# Patient Record
Sex: Male | Born: 1990 | Race: Black or African American | Hispanic: No | Marital: Married | Smoking: Current every day smoker
Health system: Southern US, Community
[De-identification: ages and names within clinical notes are randomized; demographics above are authoritative.]

---
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
## 2014-06-25 ENCOUNTER — Encounter: Payer: Self-pay | Admitting: Emergency Medicine

## 2014-06-25 ENCOUNTER — Other Ambulatory Visit: Payer: Self-pay

## 2014-06-25 ENCOUNTER — Emergency Department
Admission: EM | Admit: 2014-06-25 | Discharge: 2014-06-25 | Disposition: A | Discharge status: Home / Self Care | Payer: 59 | Attending: Emergency Medicine | Admitting: Emergency Medicine

## 2014-06-25 DIAGNOSIS — Z720 Tobacco use: Secondary | ICD-10-CM | POA: Insufficient documentation

## 2014-06-25 DIAGNOSIS — T671XXA Heat syncope, initial encounter: Secondary | ICD-10-CM | POA: Insufficient documentation

## 2014-06-25 DIAGNOSIS — R55 Syncope and collapse: Secondary | ICD-10-CM | POA: Diagnosis present

## 2014-06-25 DIAGNOSIS — Y9389 Activity, other specified: Secondary | ICD-10-CM | POA: Insufficient documentation

## 2014-06-25 DIAGNOSIS — Y990 Civilian activity done for income or pay: Secondary | ICD-10-CM | POA: Insufficient documentation

## 2014-06-25 DIAGNOSIS — R001 Bradycardia, unspecified: Secondary | ICD-10-CM | POA: Diagnosis not present

## 2014-06-25 DIAGNOSIS — Y9289 Other specified places as the place of occurrence of the external cause: Secondary | ICD-10-CM | POA: Insufficient documentation

## 2014-06-25 DIAGNOSIS — X32XXXA Exposure to sunlight, initial encounter: Secondary | ICD-10-CM | POA: Diagnosis not present

## 2014-06-25 LAB — BASIC METABOLIC PANEL
ANION GAP: 8 (ref 5–15)
BUN: 11 mg/dL (ref 6–20)
CALCIUM: 9 mg/dL (ref 8.9–10.3)
CO2: 27 mmol/L (ref 22–32)
Chloride: 102 mmol/L (ref 101–111)
Creatinine, Ser: 1.25 mg/dL — ABNORMAL HIGH (ref 0.61–1.24)
GFR calc Af Amer: >60 mL/min (ref >60)
GLUCOSE: 86 mg/dL (ref 65–99)
POTASSIUM: 3.8 mmol/L (ref 3.5–5.1)
Sodium: 137 mmol/L (ref 135–145)

## 2014-06-25 LAB — CBC
HCT: 37.9 % — ABNORMAL LOW (ref 40.0–52.0)
Hemoglobin: 12.7 g/dL — ABNORMAL LOW (ref 13.0–18.0)
MCH: 28 pg (ref 26.0–34.0)
MCHC: 33.5 g/dL (ref 32.0–36.0)
MCV: 83.6 fL (ref 80.0–100.0)
PLATELETS: 199 10*3/uL (ref 150–440)
RBC: 4.53 MIL/uL (ref 4.40–5.90)
RDW: 13.8 % (ref 11.5–14.5)
WBC: 4.8 10*3/uL (ref 3.8–10.6)

## 2014-06-25 LAB — GLUCOSE, CAPILLARY: Glucose-Capillary: 82 mg/dL (ref 65–99)

## 2014-06-25 NOTE — ED Provider Notes (Signed)
[facility] Emergency Department Provider Note     Time seen: ----------------------------------------- 9:21 PM on 06/25/2014 -----------------------------------------    I have reviewed the triage vital signs and the nursing notes.   HISTORY  Chief Complaint Loss of Consciousness    HPI Troy Holmes is a 24 y.o. male who presents ER for syncopal episode while does work. Patient states he thinks he got overheated and he passed out for a split second". Patient reports someone did catch him, he has a history of same where he gets overheated. Denies any chest pain but he did have some shortness of breath. Currently denies complaints, feels better now. It occurred about 7:30 PM   History reviewed. No pertinent past medical history.  There are no active problems to display for this patient.   History reviewed. No pertinent past surgical history.  Allergies Review of patient's allergies indicates no known allergies.  Social History History  Substance Use Topics  . Smoking status: Current Some Day Smoker  . Smokeless tobacco: Not on file  . Alcohol Use: Yes    Review of Systems Constitutional: Negative for fever. Eyes: Negative for visual changes. ENT: Negative for sore throat. Cardiovascular: Negative for chest pain. Respiratory: Negative for shortness of breath. Gastrointestinal: Negative for abdominal pain, vomiting and diarrhea. Genitourinary: Negative for dysuria. Musculoskeletal: Negative for back pain. Skin: Negative for rash. Neurological: Negative for headaches, focal weakness or numbness.  10-point ROS otherwise negative.  ____________________________________________   PHYSICAL EXAM:  VITAL SIGNS: ED Triage Vitals  Enc Vitals Group     BP 06/25/14 2042 124/74 mmHg     Pulse Rate 06/25/14 2042 55     Resp 06/25/14 2042 18     Temp 06/25/14 2042 98.4 F (36.9 C)     Temp Source 06/25/14 2042 Oral     SpO2 06/25/14 2042 100  %     Weight 06/25/14 2042 147 lb (66.679 kg)     Height 06/25/14 2042  (1.803 m)     Head Cir --      Peak Flow --      Pain Score 06/25/14 2051 0     Pain Loc --      Pain Edu? --      Excl. in GC? --     Constitutional: Alert and oriented. Well appearing and in no distress. Eyes: Conjunctivae are normal. PERRL. Normal extraocular movements. ENT   Head: Normocephalic and atraumatic.   Nose: No congestion/rhinnorhea.   Mouth/Throat: Mucous membranes are moist.   Neck: No stridor. Hematological/Lymphatic/Immunilogical: No cervical lymphadenopathy. Cardiovascular: Slow rate, regular rhythm. Normal and symmetric distal pulses are present in all extremities. No murmurs, rubs, or gallops. Respiratory: Normal respiratory effort without tachypnea nor retractions. Breath sounds are clear and equal bilaterally. No wheezes/rales/rhonchi. Gastrointestinal: Soft and nontender. No distention. No abdominal bruits. There is no CVA tenderness. Musculoskeletal: Nontender with normal range of motion in all extremities. No joint effusions.  No lower extremity tenderness nor edema. Neurologic:  Normal speech and language. No gross focal neurologic deficits are appreciated. Speech is normal. No gait instability. Skin:  Skin is warm, dry and intact. No rash noted. Psychiatric: Mood and affect are normal. Speech and behavior are normal. Patient exhibits appropriate insight and judgment. ____________________________________________  EKG: Interpreted by me. Market sinus bradycardia with a rate of 47, otherwise normal axis normal intervals, no evidence of hypertrophy or acute infarction  ____________________________________________  ED COURSE:  Pertinent labs & imaging results that were available  during my care of the patient were reviewed by me and considered in my medical decision making (see chart for details). We'll check basic labs and  reevaluate. ____________________________________________    LABS (pertinent positives/negatives)  Labs Reviewed  CBC - Abnormal; Notable for the following:    Hemoglobin 12.7 (*)    HCT 37.9 (*)    All other components within normal limits  BASIC METABOLIC PANEL - Abnormal; Notable for the following:    Creatinine, Ser 1.25 (*)    All other components within normal limits  GLUCOSE, CAPILLARY  CBG MONITORING, ED    RADIOLOGY  None  ____________________________________________  FINAL ASSESSMENT AND PLAN  He related illness, bradycardia  Plan: Labs here grossly unremarkable. Patient stable for outpatient follow-up with cardiology due to his bradycardia.   Emily FilbertWilliams, Efstathios Sawin E, MD   Emily FilbertJonathan E Demarious Kapur, MD 06/25/14 [phone]

## 2014-06-25 NOTE — Discharge Instructions (Signed)
Bradycardia Bradycardia is a term for a heart rate (pulse) that, in adults, is slower than 60 beats per minute. A normal rate is 60 to 100 beats per minute. A heart rate below 60 beats per minute may be normal for some adults with healthy hearts. If the rate is too slow, the heart may have trouble pumping the volume of blood the body needs. If the heart rate gets too low, blood flow to the brain may be decreased and may make you feel lightheaded, dizzy, or faint. The heart has a natural pacemaker in the top of the heart called the SA node (sinoatrial or sinus node). This pacemaker sends out regular electrical signals to the muscle of the heart, telling the heart muscle when to beat (contract). The electrical signal travels from the upper parts of the heart (atria) through the AV node (atrioventricular node), to the lower chambers of the heart (ventricles). The ventricles squeeze, pumping the blood from your heart to your lungs and to the rest of your body. CAUSES   Problem with the heart's electrical system.  Problem with the heart's natural pacemaker.  Heart disease, damage, or infection.  Medications.  Problems with minerals and salts (electrolytes). SYMPTOMS   Fainting (syncope).  Fatigue and weakness.  Shortness of breath (dyspnea).  Chest pain (angina).  Drowsiness.  Confusion. DIAGNOSIS   An electrocardiogram (ECG) can help your caregiver determine the type of slow heart rate you have.  If the cause is not seen on an ECG, you may need to wear a heart monitor that records your heart rhythm for several hours or days.  Blood tests. TREATMENT   Electrolyte supplements.  Medications.  Withholding medication which is causing a slow heart rate.  Pacemaker placement. SEEK IMMEDIATE MEDICAL CARE IF:   You feel lightheaded or faint.  You develop an irregular heart rate.  You feel chest pain or have trouble breathing. MAKE SURE YOU:   Understand these  instructions.  Will watch your condition.  Will get help right away if you are not doing well or get worse. Document Released: 09/27/2001 Document Revised: 03/30/2011 Document Reviewed: 04/12/2013 [facility] Patient Information 2015 [place], [place]. This information is not intended to replace advice given to you by your health care provider. Make sure you discuss any questions you have with your health care provider.  Heat-Related Illness Heat-related illnesses occur when the body is unable to properly cool itself. The body normally cools itself by sweating. However, under some conditions sweating is not enough. In these cases, a person's body temperature rises rapidly. Very high body temperatures may damage the brain or other vital organs. Some examples of heat-related illnesses include:  Heat stroke. This occurs when the body is unable to regulate its temperature. The body's temperature rises rapidly, the sweating mechanism fails, and the body is unable to cool down. Body temperature may rise to 106 F (41 C) or higher within 10 to 15 minutes. Heat stroke can cause death or permanent disability if emergency treatment is not provided.  Heat exhaustion. This is a milder form of heat-related illness that can develop after several days of exposure to high temperatures and not enough fluids. It is the body's response to an excessive loss of the water and salt contained in sweat.  Heat cramps. These usually affect people who sweat a lot during heavy activity. This sweating drains the body's salt and moisture. The low salt level in the muscles causes painful cramps. Heat cramps may also be a symptom  of heat exhaustion. Heat cramps usually occur in the abdomen, arms, or legs. Get medical attention for cramps if you have heart problems or are on a low-sodium diet. Those that are at greatest risk for heat-related illnesses include:   The elderly.  Infant and the very young.  People with mental illness  and chronic diseases.  People who are overweight (obese).  Young and healthy people can even succumb to heat if they participate in strenuous physical activities during hot weather. CAUSES  Several factors affect the body's ability to cool itself during extremely hot weather. When the humidity is high, sweat will not evaporate as quickly. This prevents the body from releasing heat quickly. Other factors that can affect the body's ability to cool down include:   Age.  Obesity.  Fever.  Dehydration.  Heart disease.  Mental illness.  Poor circulation.  Sunburn.  Prescription drug use.  Alcohol use. SYMPTOMS  Heat stroke: Warning signs of heat stroke vary, but may include:  An extremely high body temperature (above 103F orally).  A fast, strong pulse.  Dizziness.  Confusion.  Red, hot, and dry skin.  No sweating.  Throbbing headache.  Feeling sick to your stomach (nauseous).  Unconsciousness. Heat exhaustion: Warning signs of heat exhaustion include:  Heavy sweating.  Tiredness.  Headache.  Paleness.  Weakness.  Feeling sick to your stomach (nauseous) or vomiting.  Muscle cramps. Heat cramps  Muscle pains or spasms. TREATMENT  Heat stroke  Get into a cool environment. An indoor place that is air-conditioned may be best.  Take a cool shower or bath. Have someone around to make sure you are okay.  Take your temperature. Make sure it is going down. Heat exhaustion  Drink plenty of fluids. Do not drink liquids that contain caffeine, alcohol, or large amounts of sugar. These cause you to lose more body fluid. Also, avoid very cold drinks. They can cause stomach cramps.  Get into a cool environment. An indoor place that is air-conditioned may be best.  Take a cool shower or bath. Have someone around to make sure you are okay.  Put on lightweight clothing. Heat cramps  Stop whatever activity you were doing. Do not attempt to do that activity  for at least 3 hours after the cramps have gone away.  Get into a cool environment. An indoor place that is air-conditioned may be best. HOME CARE INSTRUCTIONS  To protect your health when temperatures are extremely high, follow these tips:  During heavy exercise in a hot environment, drink two to four glasses (16-32 ounces) of cool fluids each hour. Do not wait until you are thirsty to drink. Warning: If your caregiver limits the amount of fluid you drink or has you on water pills, ask how much you should drink while the weather is hot.  Do not drink liquids that contain caffeine, alcohol, or large amounts of sugar. These cause you to lose more body fluid.  Avoid very cold drinks. They can cause stomach cramps.  Wear appropriate clothing. Choose lightweight, light-colored, loose-fitting clothing.  If you must be outdoors, try to limit your outdoor activity to morning and evening hours. Try to rest often in shady areas.  If you are not used to working or exercising in a hot environment, start slowly and pick up the pace gradually.  Stay cool in an air-conditioned place if possible. If your home does not have air conditioning, go to the shopping mall or [employer].  Taking a cool shower or  bath may help you cool off. SEEK MEDICAL CARE IF:   You see any of the symptoms listed above. You may be dealing with a life-threatening emergency.  Symptoms worsen or last longer than 1 hour.  Heat cramps do not get better in 1 hour. MAKE SURE YOU:   Understand these instructions.  Will watch your condition.  Will get help right away if you are not doing well or get worse. Document Released: 10/15/2007 Document Revised: 03/30/2011 Document Reviewed: 10/15/2007 [facility] Patient Information 2015 [place], [place]. This information is not intended to replace advice given to you by your health care provider. Make sure you discuss any questions you have with your health care provider.

## 2014-06-25 NOTE — ED Notes (Signed)
Pt [facility] by [facility] from work after he experienced a syncopal episode. Pt states he was working and had gotten hot and passed out "for a split second". Pt reports someone did catch him. Hx of the same. Denies chest pain but does report some shortness of breath. Pt currently has no increased work of breathing noted. Pt alert and calm; answering questions without difficulty. No distress noted.

## 2014-06-25 NOTE — ED Notes (Signed)
Pt to STAT registration via w/c brought in by EMS from [employer]; pt st while working today had near syncopal episode due to heat; EMS st this is 2nd call at same place today for same reason (different person); pt denies c/o at present

## 2014-07-12 ENCOUNTER — Encounter: Payer: Self-pay | Admitting: Emergency Medicine

## 2014-07-12 ENCOUNTER — Emergency Department
Admission: EM | Admit: 2014-07-12 | Discharge: 2014-07-12 | Disposition: A | Discharge status: Home / Self Care | Payer: 59 | Attending: Emergency Medicine | Admitting: Emergency Medicine

## 2014-07-12 DIAGNOSIS — E860 Dehydration: Secondary | ICD-10-CM | POA: Insufficient documentation

## 2014-07-12 DIAGNOSIS — J0110 Acute frontal sinusitis, unspecified: Secondary | ICD-10-CM | POA: Diagnosis not present

## 2014-07-12 DIAGNOSIS — Z720 Tobacco use: Secondary | ICD-10-CM | POA: Insufficient documentation

## 2014-07-12 DIAGNOSIS — R0602 Shortness of breath: Secondary | ICD-10-CM | POA: Diagnosis present

## 2014-07-12 LAB — CBC WITH DIFFERENTIAL/PLATELET
Basophils Absolute: 0 10*3/uL (ref 0–0.1)
Basophils Relative: 1 %
Eosinophils Absolute: 0.1 10*3/uL (ref 0–0.7)
Eosinophils Relative: 1 %
HCT: 41.9 % (ref 40.0–52.0)
Hemoglobin: 13.8 g/dL (ref 13.0–18.0)
LYMPHS ABS: 2 10*3/uL (ref 1.0–3.6)
LYMPHS PCT: 44 %
MCH: 27.2 pg (ref 26.0–34.0)
MCHC: 32.9 g/dL (ref 32.0–36.0)
MCV: 82.8 fL (ref 80.0–100.0)
MONOS PCT: 7 %
Monocytes Absolute: 0.3 10*3/uL (ref 0.2–1.0)
NEUTROS PCT: 47 %
Neutro Abs: 2.2 10*3/uL (ref 1.4–6.5)
PLATELETS: 217 10*3/uL (ref 150–440)
RBC: 5.06 MIL/uL (ref 4.40–5.90)
RDW: 14 % (ref 11.5–14.5)
WBC: 4.6 10*3/uL (ref 3.8–10.6)

## 2014-07-12 LAB — BASIC METABOLIC PANEL
Anion gap: 5 (ref 5–15)
BUN: 12 mg/dL (ref 6–20)
CALCIUM: 8.8 mg/dL — AB (ref 8.9–10.3)
CO2: 30 mmol/L (ref 22–32)
Chloride: 103 mmol/L (ref 101–111)
Creatinine, Ser: 1.21 mg/dL (ref 0.61–1.24)
GFR calc Af Amer: >60 mL/min (ref >60)
GFR calc non Af Amer: >60 mL/min (ref >60)
Glucose, Bld: 80 mg/dL (ref 65–99)
POTASSIUM: 3.8 mmol/L (ref 3.5–5.1)
SODIUM: 138 mmol/L (ref 135–145)

## 2014-07-12 MED ORDER — DEXAMETHASONE SODIUM PHOSPHATE 10 MG/ML IJ SOLN
INTRAMUSCULAR | Status: AC
  Administered 2014-07-12: 10 mg via INTRAVENOUS
  Filled 2014-07-12: qty 1

## 2014-07-12 MED ORDER — DIPHENHYDRAMINE HCL 50 MG/ML IJ SOLN
INTRAMUSCULAR | Status: AC
  Administered 2014-07-12: 25 mg via INTRAVENOUS
  Filled 2014-07-12: qty 1

## 2014-07-12 MED ORDER — SODIUM CHLORIDE 0.9 % IV BOLUS (SEPSIS)
1000.0000 mL | Freq: Once | INTRAVENOUS | Status: AC
  Administered 2014-07-12: 1000 mL via INTRAVENOUS

## 2014-07-12 MED ORDER — LORATADINE-PSEUDOEPHEDRINE ER 5-120 MG PO TB12: 1.0000 | ORAL_TABLET | Freq: Two times a day (BID) | ORAL | Status: AC

## 2014-07-12 MED ORDER — OXYMETAZOLINE HCL 0.05 % NA SOLN: 2.0000 | Freq: Two times a day (BID) | NASAL | Status: DC

## 2014-07-12 MED ORDER — DEXAMETHASONE SODIUM PHOSPHATE 10 MG/ML IJ SOLN
10.0000 mg | Freq: Once | INTRAMUSCULAR | Status: AC
Start: 2014-07-12 — End: 2014-07-12
  Administered 2014-07-12: 10 mg via INTRAVENOUS

## 2014-07-12 MED ORDER — IBUPROFEN 800 MG PO TABS: 800.0000 mg | ORAL_TABLET | Freq: Three times a day (TID) | ORAL | Status: DC | PRN

## 2014-07-12 MED ORDER — DIPHENHYDRAMINE HCL 50 MG/ML IJ SOLN
25.0000 mg | Freq: Once | INTRAMUSCULAR | Status: AC
  Administered 2014-07-12: 25 mg via INTRAVENOUS

## 2014-07-12 NOTE — Discharge Instructions (Signed)
Dehydration in Sports The body uses the kidney, bladder, and thirst mechanisms in an intricate system to maintain the proper fluid levels in the body. When this system is stressed, such as during exercise, the system may not be able to maintain these levels. This results in the body lacking water (dehydration). Dehydration can be a problem because the body requires a certain amount of water and other fluids to maintain its blood volume. Fluid is lost when you urinate, sweat, breathe, vomit, or have diarrhea. Dehydration occurs when you drink less fluid than you lose. Dehydration may occur even before you become thirsty. It is important for athletes to keep drinking during activity, even if they do not feel thirsty. SYMPTOMS   Thirst.  Dry mouth.  Tiredness (lethargy).  Dark urine.  Headache.  Muscle cramps.  Rapid breathing.  Lightheadedness, especially when you stand from a sitting position.  Dry, warm skin.  Little or no urination.  Low blood pressure.  Fainting (syncope).  Delirium or unconsciousness. RISK INCREASES WITH:  Diarrhea.  Vomiting.  Inadequate fluid intake during an illness or strenuous exercise.  Inadequate food intake during an illness, during strenuous exercise, or after strenuous exercise.  Use of diuretic medicines, which control excess body fluid by causing fluid loss.  Certain age groups. Infants and the elderly are at greater risk. PREVENTION  Drink frequently throughout physical activity even if you do not feel thirsty. Drink small amounts of fluid frequently throughout and after sporting events.  Drink extra fluids to keep up with any ongoing losses (sweating, diarrhea).  Carry extra water and the ingredients for making an oral rehydration solution (ORS).  If you have diarrhea or vomiting or you are not drinking much, force yourself to drink more liquids before you become dehydrated. RELATED COMPLICATIONS   Reduced ability to dissipate  heat, resulting in elevated core body temperatures.  Heat illness.  Heat stroke.  Kidney failure. TREATMENT Mild dehydration is treated by drinking enough fluid to replace the fluids you have lost. You may also need to replace the electrolytes you have lost. Recommendations for replenishing fluids and electrolytes include drinking sips of water slowly, eating foods with salt, drinking sports drinks, or taking over-the-counter dehydration medicines. Treat dehydration immediately. Do not wait until dehydration becomes severe.  Packets of ORS are widely available. Follow the directions on the packet. If no instructions are given, mix the contents of the ORS with 1 quart or liter of drinking water. If you are not sure if the water is safe to drink, first boil the water for at least 5 minutes.  If you are unable to obtain ORS you may create your own by adding 2 tbs of sugar or honey,  tsp salt, and  tsp baking soda to 1 quart or liter of water. If you do not have any baking soda, add another  tsp of salt. If possible, add  cup orange juice or some mashed banana to improve the taste and provide some potassium. Drink sips of the ORS every 5 minutes until urination becomes normal. It is normal to urinate 4 or 5 times a day. Adults and adolescents should drink at least 3 quarts or liters of ORS a day until they are well. For individuals who are vomiting or have diarrhea, it is important to keep trying to drink the ORS. Your body may retain some of the fluids and salts you need even if you are vomiting or have diarrhea. Remember to take only sips of liquids. Chilling   the ORS may help. Severe dehydration is a medical emergency. If you have symptoms of severe dehydration, seek medical care immediately. It may be necessary for you to receive intravenous (IV) fluids. If you are able to drink, you should also drink the ORS. With treatment for dehydration, whatever is causing diarrhea, vomiting, or other symptoms  should also be treated.  Document Released: 01/05/2005 Document Revised: 03/30/2011 Document Reviewed: 04/19/2008 [employer] Patient Information 2015 [employer]. This information is not intended to replace advice given to you by your health care provider. Make sure you discuss any questions you have with your health care provider.  

## 2014-07-12 NOTE — ED Notes (Signed)
Pt states sore throat and difficulty to breathe out of his nose, pt also states dizziness when it gets too hot outside, pt in no distress upon assessment

## 2014-07-12 NOTE — ED Notes (Signed)
Pt alert and oriented X4, active, cooperative, pt in NAD. RR even and unlabored, color WNL.  Pt informed to return if any life threatening symptoms occur.   

## 2014-07-12 NOTE — ED Provider Notes (Signed)
[facility] Emergency Department Provider Note  ____________________________________________  Time seen: Approximately 2:29 PM  I have reviewed the triage vital signs and the nursing notes.   HISTORY  Chief Complaint Shortness of Breath    HPI Troy Holmes is a 24 y.o. 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  There are no active problems to display for this patient.   No past surgical history on file.  Current Outpatient Rx  Name  Route  Sig  Dispense  Refill  . ibuprofen (ADVIL,MOTRIN) 800 MG tablet   Oral   Take 1 tablet (800 mg total) by mouth every 8 (eight) hours as needed.   30 tablet   0   . loratadine-pseudoephedrine (CLARITIN-D 12 HOUR) 5-120 MG per tablet   Oral   Take 1 tablet by mouth 2 (two) times daily.   20 tablet   0   . oxymetazoline (AFRIN) 0.05 % nasal spray   Each Nare   Place 2 sprays into both nostrils 2 (two) times daily. For 3 days   15 mL   2     Allergies Review of patient's allergies indicates no known allergies.  History reviewed. No pertinent family history.  Social History History  Substance Use Topics  . Smoking status: Current Some Day Smoker    Types: Cigarettes  . Smokeless tobacco: Not on file  . Alcohol Use: Yes    Review of Systems Constitutional: No fever/chills Eyes: No visual changes. ENT: No sore  throat. Cardiovascular: Denies chest pain. Respiratory: Denies shortness of breath. Gastrointestinal: No abdominal pain.  No nausea, no vomiting.  No diarrhea.  No constipation. Genitourinary: Negative for dysuria. Musculoskeletal: Negative for back pain. Skin: Negative for rash. Neurological: Negative for headaches, focal weakness or numbness.  10-point ROS otherwise negative.  ____________________________________________   PHYSICAL EXAM:  VITAL SIGNS: ED Triage Vitals  Enc Vitals Group     BP 07/12/14 1234 113/65 mmHg     Pulse Rate 07/12/14 1234 76     Resp 07/12/14 1234 18     Temp 07/12/14 1234 98.7 F (37.1 C)     Temp Source 07/12/14 1234 Oral     SpO2 07/12/14 1234 99 %     Weight 07/12/14 1234 138 lb (62.596 kg)     Height 07/12/14 1234 5\' 11"  (1.803 m)     Head Cir --      Peak Flow --      Pain Score 07/12/14 1235 2     Pain Loc --      Pain Edu? --      Excl. in GC? --     Constitutional: Alert and oriented. Well appearing and in no acute distress. Eyes: Conjunctivae are normal. PERRL. EOMI. Head: Atraumatic. Frontal sinus tenderness Nose: congestion/rhinnorhea. Mouth/Throat: Mucous membranes are moist.  Oropharynx non-erythematous. Neck: No stridor.   Cardiovascular: Normal  rate, regular rhythm. Grossly normal heart sounds.  Good peripheral circulation. Respiratory: Normal respiratory effort.  No retractions. Lungs CTAB. Gastrointestinal: Soft and nontender. No distention. No abdominal bruits. No CVA tenderness. Musculoskeletal: No lower extremity tenderness nor edema.  No joint effusions. Neurologic:  Normal speech and language. No gross focal neurologic deficits are appreciated. Speech is normal. No gait instability. Skin:  Skin is warm, dry and intact. No rash noted. Psychiatric: Mood and affect are normal. Speech and behavior are normal.  ____________________________________________   LABS (all labs ordered are listed, but only abnormal results  are displayed)  Labs Reviewed  CBC WITH DIFFERENTIAL/PLATELET  BASIC METABOLIC PANEL   ____________________________________________   PROCEDURES  Procedure(s) performed: None  Critical Care performed: No  ____________________________________________   INITIAL IMPRESSION / ASSESSMENT AND PLAN / ED COURSE  Pertinent labs & imaging results that were available during my care of the patient were reviewed by me and considered in my medical decision making (see chart for details).  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

## 2014-07-12 NOTE — ED Notes (Addendum)
Pt states he has had some sob since last night, denies any coughing, states "I think it is because I am trying to quite smoking black and milds", pt also states he felt dizzy and lightheaded after walking around the block, denies any dizziness at present, also c/o sore throat and nasal congestion

## 2014-12-18 ENCOUNTER — Encounter (HOSPITAL_COMMUNITY): Payer: Self-pay | Admitting: Emergency Medicine

## 2014-12-18 ENCOUNTER — Emergency Department (HOSPITAL_COMMUNITY)
Admission: EM | Admit: 2014-12-18 | Discharge: 2014-12-19 | Disposition: A | Discharge status: Home / Self Care | Payer: 59 | Attending: Emergency Medicine | Admitting: Emergency Medicine

## 2014-12-18 ENCOUNTER — Emergency Department (HOSPITAL_COMMUNITY): Payer: 59

## 2014-12-18 DIAGNOSIS — F17210 Nicotine dependence, cigarettes, uncomplicated: Secondary | ICD-10-CM | POA: Insufficient documentation

## 2014-12-18 DIAGNOSIS — R55 Syncope and collapse: Secondary | ICD-10-CM | POA: Insufficient documentation

## 2014-12-18 DIAGNOSIS — R51 Headache: Secondary | ICD-10-CM | POA: Insufficient documentation

## 2014-12-18 DIAGNOSIS — R519 Headache, unspecified: Secondary | ICD-10-CM

## 2014-12-18 MED ORDER — DIPHENHYDRAMINE HCL 25 MG PO CAPS
50.0000 mg | ORAL_CAPSULE | Freq: Once | ORAL | Status: AC
  Administered 2014-12-18: 50 mg via ORAL
  Filled 2014-12-18: qty 2

## 2014-12-18 MED ORDER — PREDNISONE 20 MG PO TABS
60.0000 mg | ORAL_TABLET | Freq: Once | ORAL | Status: AC
  Administered 2014-12-18: 60 mg via ORAL
  Filled 2014-12-18: qty 3

## 2014-12-18 MED ORDER — METHYLPREDNISOLONE SODIUM SUCC 125 MG IJ SOLR: 125.0000 mg | Freq: Once | INTRAMUSCULAR | Status: DC

## 2014-12-18 MED ORDER — DIPHENHYDRAMINE HCL 50 MG/ML IJ SOLN: 50.0000 mg | Freq: Once | INTRAMUSCULAR | Status: DC

## 2014-12-18 MED ORDER — PROCHLORPERAZINE EDISYLATE 5 MG/ML IJ SOLN: 10.0000 mg | Freq: Four times a day (QID) | INTRAMUSCULAR | Status: DC | PRN

## 2014-12-18 MED ORDER — KETOROLAC TROMETHAMINE 60 MG/2ML IM SOLN
60.0000 mg | Freq: Once | INTRAMUSCULAR | Status: AC
  Administered 2014-12-18: 60 mg via INTRAMUSCULAR
  Filled 2014-12-18: qty 2

## 2014-12-18 MED ORDER — KETOROLAC TROMETHAMINE 30 MG/ML IJ SOLN: 30.0000 mg | Freq: Once | INTRAMUSCULAR | Status: DC

## 2014-12-18 MED ORDER — METOCLOPRAMIDE HCL 10 MG PO TABS
10.0000 mg | ORAL_TABLET | Freq: Once | ORAL | Status: AC
  Administered 2014-12-18: 10 mg via ORAL
  Filled 2014-12-18: qty 1

## 2014-12-18 MED ORDER — SODIUM CHLORIDE 0.9 % IV BOLUS (SEPSIS): 1000.0000 mL | Freq: Once | INTRAVENOUS | Status: DC

## 2014-12-18 NOTE — Discharge Instructions (Signed)
You have been seen today for headache and near syncope. Your imaging and lab tests showed no abnormalities. Follow up with PCP as soon as possible on this matter. Return to ED should symptoms worsen. Be sure to drink plenty of fluids, get plenty of rest, and eat normal meals. You may take ibuprofen or Tylenol with caffeine for future headaches.   Emergency Department Resource Guide 1) Find a Doctor and Pay Out of Pocket Although you won't have to find out who is covered by your insurance plan, it is a good idea to ask around and get recommendations. You will then need to call the office and see if the doctor you have chosen will accept you as a new patient and what types of options they offer for patients who are self-pay. Some doctors offer discounts or will set up payment plans for their patients who do not have insurance, but you will need to ask so you aren't surprised when you get to your appointment.  2) Contact Your Local Health Department Not all health departments have doctors that can see patients for sick visits, but many do, so it is worth a call to see if yours does. If you don't know where your local health department is, you can check in your phone book. The CDC also has a tool to help you locate your state's health department, and many state websites also have listings of all of their local health departments.  3) Find a Walk-in Clinic If your illness is not likely to be very severe or complicated, you may want to try a walk in clinic. These are popping up all over the country in pharmacies, drugstores, and shopping centers. They're usually staffed by nurse practitioners or physician assistants that have been trained to treat common illnesses and complaints. They're usually fairly quick and inexpensive. However, if you have serious medical issues or chronic medical problems, these are probably not your best option.  No Primary Care Doctor: - Call Health Connect at  [phone] - they can  help you locate a primary care doctor that  accepts your insurance, provides certain services, etc. - Physician Referral Service- [phone]  Chronic Pain Problems: Organization         Address  Phone   Notes  [facility]  [phone] Patients need to be referred by their primary care doctor.   Medication Assistance: Organization         Address  Phone   Notes  [facility] [address] [place], [place] [id number] [phone] --Must be a resident of [facility] -- Must have NO insurance coverage whatsoever (no Medicaid/ Medicare, etc.) -- The pt. MUST have a primary care doctor that directs their care regularly and follows them in the community   MedAssist  [phone]   [employer]  [phone]    Agencies that provide inexpensive medical care: Organization         Address  Phone   Notes  [facility]  [phone]   [facility]    [phone]   [facility] [address] [place], [place] [address] [phone]   [facility] [address], [place] [phone]   Planned Parenthood    [phone]   [facility]    [phone]   [facility] and [facility]  [address], [place] Phone:  [phone], Fax:  [phone] Hours of Operation:  9 am - 6 pm, M-F.  Also accepts Medicaid/Medicare and self-pay.  [facility] for [place], [address], [place] Phone: [phone], Fax: [phone]. Hours of Operation:  8:30 am - 5:30 pm, M-F.  Also accepts Medicaid and self-pay.  [facility] [address] [place] Phone: [phone]   [place], [place], [place] [phone], Ext. [phone] Mondays & Thursdays: 7-9 AM.  First 15 patients are seen on a first come, first serve basis.    [place] Providers:  Organization         Address  Phone   Notes  [facility] [address], [place] [phone] Also accepts self-pay patients.  [facility] [id number] [place], [place]  [phone]   [place], [address], [place] [phone]   [facility] [address] [place] [phone]   Lucianne Lei [address], [place]   [phone] Only accepts [place] Access [place] patients after they have their name applied to their card.   Self-Pay (no insurance) in [facility]:  Organization         Address  Phone   Notes  Sickle Cell Patients, [facility] [place] [phone]   [facility] [place] [phone]   [facility] [place]  [place], [place], [place] [phone]   [facility]/Dr. Osei-Bonsu  [address], [place] or [address], [place] [phone] Phone number for both [place] and [place] locations is the same.  Urgent Medical and [facility] [address] [place] [phone]   [facility] [address] [place] or [address] [phone]   [facility] [address], [place] [phone], phone; [phone], fax Sees patients 1st and 3rd Saturday of every month.  Must not qualify for public or private insurance (i.e. Medicaid, Medicare, [place] Health Choice, Veterans' Benefits)  Household income should be no more than 200% of the poverty level The clinic cannot treat you if you are pregnant or think you are pregnant  Sexually transmitted diseases are not treated at the clinic.    Dental Care: Organization         Address  Phone  Notes  [facility] [place] [phone] Accepts children up to age 31 who are enrolled in [place] or [place]; pregnant women with a Medicaid card; and children who have applied for Medicaid or [place] Health Choice, but were declined, whose parents can pay a reduced fee at time of service.  [facility] of [facility]  [address], [place] [phone] Accepts children up to age 34 who are enrolled in [place] or [place]; pregnant women with a Medicaid card; and children who have applied for Medicaid or [place] Health Choice, but were declined, whose parents can pay a reduced fee at time of service.  [place] Adult [facility] Access PROGRAM  [place] [phone] Patients are seen by appointment only. Walk-ins  are not accepted. [place] will see patients 33 years of age and older. Monday - Tuesday (8am-5pm) Most Wednesdays (8:30-5pm) $30 per visit, cash only  [facility] Adult [facility] Access PROGRAM  [address], [facility] [phone] Patients are seen by appointment only. Walk-ins are not accepted. [place] will see patients 17 years of age and older. One Wednesday Evening (Monthly: Volunteer Based).  $30 per visit, cash only  [place]  [phone] for adults; Children under age 66, call Graduate Pediatric Dentistry at [phone]. Children aged 52-14, please call [phone] to request a pediatric application.  Dental services are provided in all areas of dental care including fillings, crowns and bridges, complete and partial dentures, implants, gum treatment, root canals, and extractions. Preventive care is also provided. Treatment is provided to both adults and children. Patients are selected via a lottery and there is often a waiting list.   [facility] [address] [place]  [phone] [email or web address]   [facility] [address] [place], [place] [phone], Ext. [phone] Second and Fourth Thursday of each month, opens at 6:30 AM; Clinic ends at 9 AM.  Patients are seen on a first-come first-served basis, and a limited number are seen during each clinic.   [facility]  [address] Hillard Danker [place], [place] [phone]   Eligibility Requirements You must have lived in [place], [place], or [place] counties for at least the last three months.   You cannot be eligible for state or federal sponsored [employer], including [employer], [place], or [employer].   You generally cannot be eligible for healthcare insurance through your employer.    How to apply: Eligibility screenings are held every Tuesday and Wednesday afternoon from 1:00 pm until 4:00 pm. You do not need an appointment for the interview!  [facility] [address], [place], [place]   [place]  [place] Department  [place]  [phone]    Behavioral Health Resources in the Community: Intensive Outpatient Programs Organization         Address  Phone  Notes  [place]. [address] [place], [place] [phone]   [facility] Outpatient [address] [place], [place]   ADS: Alcohol & Drug Svcs [address] [place], [place]   [place] [address],  [place], [place] or [phone]   Substance Abuse Resources Organization         Address  Phone  Notes  Alcohol and Drug Services  [phone]   [place]  [phone]   [facility] [place]   Chinita Pester  [phone]   Residential & Outpatient Substance Abuse Program  [phone]   Psychological Services Organization         Address  Phone  Notes  [facility] [place]  [place]  [phone]   [place] [address], [place] [phone] or [phone]    Mobile Crisis Teams Organization         Address  Phone  Notes  [facility]atives, Mobile Crisis Care Unit  [phone]   Assertive Psychotherapeutic Services  [address] [place], [place]   [facility] [address], [place] [phone]  Self-Help/Support Groups Organization         Address  Phone             Notes  [facility]oc[facility] of [place] - variety of support groups  [place] Call for more information  Narcotics Anonymous (NA), [facility] [address], [employer] [place]  2 meetings at this location   Special educational needs teacher         Address  Phone  Notes  AS[facility] Residential Treatment [place],    [place]  [phone]   [facility]  [address], [place] [address]27, [place], [place]   [place], [place] [phone] Admissions: 8am-3pm M-F  Incentives Substance [place] [address],    [employer], [place] [phone]   [facility] [address] [place], [place], [place]   [facility] [address]  [place], [place]   Insight Programs - Intensive Outpatient [address], [place], [place]   [facility] ([place] [place].,  [place], [place] [phone] or [phone]   [facility]idential Treatment Services (RTS) [address], [place], [place] Accepts Medicaid  [facility] [place] [address],  [place] [phone] Substance Abuse/Addiction Treatment   [facility] Organization         Address  Phone  Notes  [facility]  [phone]   Domenic Schwab, PhD [address] [place], [place]   [phone] or  [phone]   [place], [place] [phone]   [facility] [address], [place], [place] [phone] Insurance/Medicaid/sponsorship through [facility] and Families [address] [place]                                    [place], [place] [phone] [place] [address], [place] [phone]    Dr. Adele Schilder  [phone]   [facility] Dept. 1) [address], [place] 2) [place] 3)  [place] [address], [place] [phone]  [phone]   [place] [phone] or [phone] (After Hours)

## 2014-12-18 NOTE — ED Provider Notes (Signed)
CSN: [id number]     Arrival date & time 12/18/14  1420 History   First MD Initiated Contact with Patient 12/18/14 2136     Chief Complaint  Patient presents with  . Migraine     (Consider location/radiation/quality/duration/timing/severity/associated sxs/prior Treatment) HPI   Ridhaan Dreibelbis is a 24 y.o. male, patient with no pertinent past medical history, presenting to the ED with near syncope upon standing up from a sitting position. Pt states this has happened once before. Pt adds that he hasn't eaten anything all day because he's been here. Pt also complains of a headache he describes a migraine, bilateral, throbbing, 6/10, non-radiating. Pt requests no IV during his care today because he says he is a "hard stick" and doesn't want to get stuck multiple times. Denies fever/chills, LOC, N/V, shortness of breath, cough, chest pain, or any other complaints. Patient has no history of formal diagnosis of migraines, however patient states that this headache is similar to headaches he has had in the past.   History reviewed. No pertinent past medical history. History reviewed. No pertinent past surgical history. No family history on file. Social History  Substance Use Topics  . Smoking status: Current Some Day Smoker    Types: Cigarettes  . Smokeless tobacco: None  . Alcohol Use: Yes    Review of Systems  Constitutional: Negative for fever, chills, diaphoresis and unexpected weight change.  Respiratory: Negative for cough, chest tightness and shortness of breath.   Cardiovascular: Negative for chest pain, palpitations and leg swelling.  Gastrointestinal: Negative for nausea, vomiting, abdominal pain, diarrhea and constipation.  Genitourinary: Negative for dysuria and flank pain.  Musculoskeletal: Negative for back pain.  Skin: Negative for color change and pallor.  Neurological: Positive for headaches. Negative for dizziness, syncope, weakness and light-headedness.       Near syncope   All other systems reviewed and are negative.     Allergies  Bro[place]  Home Medications   Prior to Admission medications   Medication Sig Start Date End Date Taking? Authorizing Provider  acetaminophen (TYLENOL) 500 MG tablet Take 1,000 mg by mouth every 6 (six) hours as needed for moderate pain.   Yes Historical Provider, MD  aspirin-acetaminophen-caffeine (EXCEDRIN MIGRAINE) [phone] MG tablet Take 2 tablets by mouth every 6 (six) hours as needed for headache.   Yes Historical Provider, MD  ibuprofen (ADVIL,MOTRIN) 800 MG tablet Take 1 tablet (800 mg total) by mouth every 8 (eight) hours as needed. Patient not taking: Reported on 12/18/2014 07/12/14   III William C Ruffian, PA-C  loratadine-pseudoephedrine (CLARITIN-D 12 HOUR) 5-120 MG per tablet Take 1 tablet by mouth 2 (two) times daily. Patient not taking: Reported on 12/18/2014 07/12/14 07/12/15  III William C Ruffian, PA-C  oxymetazoline (AFRIN) 0.05 % nasal spray Place 2 sprays into both nostrils 2 (two) times daily. For 3 days Patient not taking: Reported on 12/18/2014 07/12/14   III William C Ruffian, PA-C   BP 117/65 mmHg  Pulse 53  Temp(Src) 98.1 F (36.7 C) (Oral)  Resp 13  SpO2 100% Physical Exam  Constitutional: He is oriented to person, place, and time. He appears well-developed and well-nourished. No distress.  HENT:  Head: Normocephalic and atraumatic.  Eyes: Conjunctivae and EOM are normal. Pupils are equal, round, and reactive to light.  Cardiovascular: Normal rate, regular rhythm and normal heart sounds.   Pulmonary/Chest: Effort normal and breath sounds normal. No respiratory distress.  Abdominal: Soft. Bowel sounds are normal.  Musculoskeletal: Normal range of motion.  He exhibits no edema or tenderness.  Full ROM in all extremities and spine. No paraspinal tenderness.  Neurological: He is alert and oriented to person, place, and time. He has normal reflexes.  No sensory deficits. Strength 5/5 in all  extremities. No gait disturbance. Cranial nerves III-XII grossly intact.  Skin: Skin is warm and dry. He is not diaphoretic.  Nursing note and vitals reviewed.   ED Course  Procedures (including critical care time) Labs Review Labs Reviewed  CBC WITH DIFFERENTIAL/PLATELET - Abnormal; Notable for the following:    Hemoglobin 12.7 (*)    HCT 37.4 (*)    All other components within normal limits  BASIC METABOLIC PANEL  CBG MONITORING, ED    Imaging Review Ct Head Wo Contrast  12/18/2014  CLINICAL DATA:  Syncopal episode.  Migraine. EXAM: CT HEAD WITHOUT CONTRAST TECHNIQUE: Contiguous axial images were obtained from the base of the skull through the vertex without intravenous contrast. COMPARISON:  None. FINDINGS: There is no intracranial hemorrhage, mass or evidence of acute infarction. There is no extra-axial fluid collection. Gray matter and white matter appear normal. Cerebral volume is normal for age. Brainstem and posterior fossa are unremarkable. The CSF spaces appear normal. The bony structures are intact. The visible portions of the paranasal sinuses are clear. IMPRESSION: Normal brain Electronically Signed   By: Ellery Plunkaniel R Mitchell M.D.   On: 12/18/2014 23:43   I have personally reviewed and evaluated these images and lab results as part of my medical decision-making.   EKG Interpretation   Date/Time:  Wednesday December 19 2014 00:03:03 EST Ventricular Rate:  57 PR Interval:  164 QRS Duration: 98 QT Interval:  439 QTC Calculation: 427 R Axis:   83 Text Interpretation:  Sinus rhythm RSR' in V1 or V2, probably normal  variant ST elev, probable normal early repol pattern When compared with  ECG of 06/25/2014, No significant change was found Confirmed by [facility]  MD,  DAVID ([id number]) on 12/19/2014 12:18:09 AM      MDM   Final diagnoses:  Acute nonintractable headache, unspecified headache type  Near syncope    Orene DesanctisOmoye Fickle presents with headache and episode of near syncope  after standing.  Suspect that patient's near-syncope came from standing up too fast combined with some minor dehydration. CT and labs ordered on patient request. Patient's headache was treated and upon reevaluation patient states that his pain feels much better, rates it 5 out of 10, and says that he is ready for discharge. Patient was ambulated without problems. No orthostatic changes. Since patient requested no IV, all medications and fluids were delivered by mouth. Patient also passed a fluid challenge. No acute abnormalities were found on the patient's labs. Patient was reassessed and an states that his complaints of resolved. Patient should follow up with his PCP as soon as possible for chronic management of his complaints. Patient voices understanding of these instructions, accepts the plan of care, and is comfortable with discharge.  Anselm PancoastShawn C Dedee Liss, PA-C 12/19/14 [phone]  Dione Boozeavid Glick, MD 12/19/14 [phone]

## 2014-12-18 NOTE — ED Notes (Signed)
Patient transported to CT 

## 2014-12-18 NOTE — ED Notes (Signed)
Per pt, states he blacked out a few months ago-states he started getting a migraine and feels like he is getting a cold-did not want to drive back home with out getting checked out

## 2014-12-19 LAB — BASIC METABOLIC PANEL
ANION GAP: 6 (ref 5–15)
BUN: 10 mg/dL (ref 6–20)
CO2: 27 mmol/L (ref 22–32)
Calcium: 9.3 mg/dL (ref 8.9–10.3)
Chloride: 106 mmol/L (ref 101–111)
Creatinine, Ser: 1.14 mg/dL (ref 0.61–1.24)
GFR calc Af Amer: >60 mL/min (ref >60)
GFR calc non Af Amer: >60 mL/min (ref >60)
GLUCOSE: 92 mg/dL (ref 65–99)
POTASSIUM: 3.6 mmol/L (ref 3.5–5.1)
Sodium: 139 mmol/L (ref 135–145)

## 2014-12-19 LAB — CBC WITH DIFFERENTIAL/PLATELET
Basophils Absolute: 0 10*3/uL (ref 0.0–0.1)
Basophils Relative: 1 %
Eosinophils Absolute: 0.1 10*3/uL (ref 0.0–0.7)
Eosinophils Relative: 2 %
HCT: 37.4 % — ABNORMAL LOW (ref 39.0–52.0)
HEMOGLOBIN: 12.7 g/dL — AB (ref 13.0–17.0)
LYMPHS ABS: 2.6 10*3/uL (ref 0.7–4.0)
LYMPHS PCT: 44 %
MCH: 27.7 pg (ref 26.0–34.0)
MCHC: 34 g/dL (ref 30.0–36.0)
MCV: 81.5 fL (ref 78.0–100.0)
Monocytes Absolute: 0.4 10*3/uL (ref 0.1–1.0)
Monocytes Relative: 6 %
NEUTROS ABS: 2.8 10*3/uL (ref 1.7–7.7)
Neutrophils Relative %: 47 %
Platelets: 260 10*3/uL (ref 150–400)
RBC: 4.59 MIL/uL (ref 4.22–5.81)
RDW: 13.1 % (ref 11.5–15.5)
WBC: 5.9 10*3/uL (ref 4.0–10.5)

## 2014-12-19 LAB — CBG MONITORING, ED: GLUCOSE-CAPILLARY: 84 mg/dL (ref 65–99)

## 2014-12-19 NOTE — ED Notes (Signed)
Patient able to tolerate PO fluids and graham crackers/peanut butter.

## 2014-12-19 NOTE — ED Notes (Signed)
Patient ambulatory to restroom without assistance, steady gait. 

## 2015-12-05 ENCOUNTER — Emergency Department
Admission: EM | Admit: 2015-12-05 | Discharge: 2015-12-05 | Disposition: A | Discharge status: Home / Self Care | Payer: 59 | Attending: Emergency Medicine | Admitting: Emergency Medicine

## 2015-12-05 DIAGNOSIS — Z791 Long term (current) use of non-steroidal anti-inflammatories (NSAID): Secondary | ICD-10-CM | POA: Insufficient documentation

## 2015-12-05 DIAGNOSIS — R51 Headache: Secondary | ICD-10-CM | POA: Insufficient documentation

## 2015-12-05 DIAGNOSIS — R05 Cough: Secondary | ICD-10-CM | POA: Insufficient documentation

## 2015-12-05 DIAGNOSIS — Z7982 Long term (current) use of aspirin: Secondary | ICD-10-CM | POA: Insufficient documentation

## 2015-12-05 DIAGNOSIS — F17210 Nicotine dependence, cigarettes, uncomplicated: Secondary | ICD-10-CM | POA: Insufficient documentation

## 2015-12-05 DIAGNOSIS — R519 Headache, unspecified: Secondary | ICD-10-CM

## 2015-12-05 MED ORDER — ACETAMINOPHEN 500 MG PO TABS
1000.0000 mg | ORAL_TABLET | Freq: Once | ORAL | Status: AC
  Administered 2015-12-05: 1000 mg via ORAL
  Filled 2015-12-05: qty 2

## 2015-12-05 MED ORDER — KETOROLAC TROMETHAMINE 10 MG PO TABS
10.0000 mg | ORAL_TABLET | Freq: Once | ORAL | Status: AC
  Administered 2015-12-05: 10 mg via ORAL
  Filled 2015-12-05: qty 1

## 2015-12-05 MED ORDER — NAPROXEN 500 MG PO TABS: 500.0000 mg | ORAL_TABLET | Freq: Two times a day (BID) | ORAL | 0 refills | Status: AC

## 2015-12-05 NOTE — ED Triage Notes (Signed)
Pt in with co cold symptoms for 2 days and headache. States everyone at home has had same symptoms.

## 2015-12-05 NOTE — ED Notes (Signed)
Pt reports having recent sinus cold and having runny nose.  Pt reports taking aspirin 1 hour ago.

## 2015-12-05 NOTE — Discharge Instructions (Signed)
Take pain medicine as directed. If symptoms worsen or new symptoms develop, he should be evaluated again. Follow-up with Phineas Realharles Drew or return to emergency room for any concerns.

## 2015-12-05 NOTE — ED Notes (Signed)
Pt discharged to home.  Family member driving.  Discharge instructions reviewed.  Verbalized understanding.  No questions or concerns at this time.  Teach back verified.  Pt in NAD.  No items left in ED.   

## 2015-12-05 NOTE — ED Provider Notes (Signed)
[facility] Emergency Department Provider Note  ____________________________________________  Time seen: Approximately 11:44 PM  I have reviewed the triage vital signs and the nursing notes.   HISTORY  Chief Complaint Headache    HPI Troy Holmes is a 25 y.o. male who reports headache for proximal to 3 days, initially with head congestion as well. He had general malaise and minimal cough. Congestion improved but headache is persistent, mainly left side. No ear pain currently or sore throat. No fevers or chills. No visual changes. Headache is constant, nonradiating. No sudden onset. Similar headache a year ago.   No past medical history on file.  There are no active problems to display for this patient.   No past surgical history on file.  Current Outpatient Rx  . Order #: [id number] Class: Historical Med  . Order #: [id number] Class: Historical Med  . Order #: [id number] Class: Print  . Order #: [id number] Class: Print  . Order #: [id number] Class: Print    Allergies Broccoli [place][brassica oleracea italica]  No family history on file.  Social History Social History  Substance Use Topics  . Smoking status: Current Some Day Smoker    Types: Cigarettes  . Smokeless tobacco: Not on file  . Alcohol use Yes    Review of Systems Constitutional: No fever/chills Eyes: No visual changes. ENT: No sore throat. Cardiovascular: Denies chest pain. Respiratory: Denies shortness of breath. Gastrointestinal: No abdominal pain.  No nausea, no vomiting.  No diarrhea.  No constipation. Genitourinary: Negative for dysuria. Musculoskeletal: Negative for back pain. Skin: Negative for rash. Neurological: Negative for focal weakness or numbness. 10-point ROS otherwise negative.  ____________________________________________   PHYSICAL EXAM:  VITAL SIGNS: ED Triage Vitals [12/05/15 2135]  Enc Vitals Group     BP 117/71     Pulse Rate (!) 54     Resp 20     Temp  98.4 F (36.9 C)     Temp Source Oral     SpO2 97 %     Weight 136 lb (61.7 kg)     Height 5\' 11"  (1.803 m)     Head Circumference      Peak Flow      Pain Score 7     Pain Loc      Pain Edu?      Excl. in GC?     Constitutional: Alert and oriented. Well appearing and in no acute distress. Eyes: Conjunctivae are normal. PERRL. EOMI. Ears:  Clear with normal landmarks. No erythema. Head: Atraumatic.No TMJ tenderness. Nose: No congestion/rhinnorhea. Mouth/Throat: Mucous membranes are moist.  Oropharynx non-erythematous. No lesions. Neck:  Supple.  No adenopathy.   Cardiovascular: Normal rate, regular rhythm. Grossly normal heart sounds.  Good peripheral circulation. Respiratory: Normal respiratory effort.  No retractions. Lungs CTAB. Musculoskeletal: Nml ROM of upper and lower extremity joints. Neurologic:  Normal speech and language. No gross focal neurologic deficits are appreciated. No gait instability. Skin:  Skin is warm, dry and intact. No rash noted. Psychiatric: Mood and affect are normal. Speech and behavior are normal.  ____________________________________________   LABS (all labs ordered are listed, but only abnormal results are displayed)  Labs Reviewed - No data to display ____________________________________________  EKG   ____________________________________________  RADIOLOGY   ____________________________________________   PROCEDURES  Procedure(s) performed: None  Critical Care performed: No  ____________________________________________   INITIAL IMPRESSION / ASSESSMENT AND PLAN / ED COURSE  Pertinent labs & imaging results that were available during my care of the patient were reviewed  by me and considered in my medical decision making (see chart for details).  [age] year old with 3 days of headache, mostly left side. Similar to previous headaches. Also initially with URI symptoms that have improved. Low suspicion for sinusitis. Suspect  tension versus migraine headache and given NSAID. Can follow-up with Phineas Realharles Drew or return to the emergency room if not improving. ____________________________________________   FINAL CLINICAL IMPRESSION(S) / ED DIAGNOSES  Final diagnoses:  Acute nonintractable headache, unspecified headache type      Ignacia BayleyRobert Jaja Switalski, PA-C 12/05/15 2349    Jennye MoccasinBrian S Quigley, MD 12/10/15 [phone]

## 2015-12-14 ENCOUNTER — Encounter: Payer: Self-pay | Admitting: Emergency Medicine

## 2015-12-14 ENCOUNTER — Emergency Department: Payer: Self-pay

## 2015-12-14 ENCOUNTER — Emergency Department
Admission: EM | Admit: 2015-12-14 | Discharge: 2015-12-14 | Disposition: A | Discharge status: Home / Self Care | Payer: Self-pay | Attending: Emergency Medicine | Admitting: Emergency Medicine

## 2015-12-14 DIAGNOSIS — S8990XA Unspecified injury of unspecified lower leg, initial encounter: Secondary | ICD-10-CM

## 2015-12-14 DIAGNOSIS — F17290 Nicotine dependence, other tobacco product, uncomplicated: Secondary | ICD-10-CM | POA: Insufficient documentation

## 2015-12-14 DIAGNOSIS — Y999 Unspecified external cause status: Secondary | ICD-10-CM | POA: Insufficient documentation

## 2015-12-14 DIAGNOSIS — Y9367 Activity, basketball: Secondary | ICD-10-CM | POA: Insufficient documentation

## 2015-12-14 DIAGNOSIS — Y929 Unspecified place or not applicable: Secondary | ICD-10-CM | POA: Insufficient documentation

## 2015-12-14 DIAGNOSIS — X501XXA Overexertion from prolonged static or awkward postures, initial encounter: Secondary | ICD-10-CM | POA: Insufficient documentation

## 2015-12-14 DIAGNOSIS — S8992XA Unspecified injury of left lower leg, initial encounter: Secondary | ICD-10-CM | POA: Insufficient documentation

## 2015-12-14 DIAGNOSIS — Z7982 Long term (current) use of aspirin: Secondary | ICD-10-CM | POA: Insufficient documentation

## 2015-12-14 DIAGNOSIS — Z791 Long term (current) use of non-steroidal anti-inflammatories (NSAID): Secondary | ICD-10-CM | POA: Insufficient documentation

## 2015-12-14 DIAGNOSIS — Z79899 Other long term (current) drug therapy: Secondary | ICD-10-CM | POA: Insufficient documentation

## 2015-12-14 MED ORDER — MELOXICAM 7.5 MG PO TABS
7.5000 mg | ORAL_TABLET | Freq: Once | ORAL | Status: AC
  Administered 2015-12-14: 7.5 mg via ORAL
  Filled 2015-12-14: qty 1

## 2015-12-14 MED ORDER — MELOXICAM 7.5 MG PO TABS: 7.5000 mg | ORAL_TABLET | Freq: Every day | ORAL | 0 refills | Status: DC

## 2015-12-14 NOTE — ED Triage Notes (Signed)
L knee pain, twisted playing basketball 45 min ago.

## 2015-12-14 NOTE — ED Provider Notes (Signed)
[facility] Emergency Department Provider Note ____________________________________________  Time seen: Approximately 4:27 PM  I have reviewed the triage vital signs and the nursing notes.   HISTORY  Chief Complaint Knee Pain    HPI Troy Holmes is a 25 y.o. male that presents with left knee pain. Patient was playing basketball at 2pm today when he collided with 2 other players. Patient initially tried to walk on knee but it was too painful. He had to have people help get him to the car. Patient states that he has shooting pains that begin at his knee and go to his feet. Patient denies numbness or tingling in toes. Patient denies swelling or bruising. Patient has never injured knee before. Patient has not taken anything for pain.   History reviewed. No pertinent past medical history.  There are no active problems to display for this patient.   History reviewed. No pertinent surgical history.  Prior to Admission medications   Medication Sig Start Date End Date Taking? Authorizing Provider  acetaminophen (TYLENOL) 500 MG tablet Take 1,000 mg by mouth every 6 (six) hours as needed for moderate pain.    Historical Provider, MD  aspirin-acetaminophen-caffeine (EXCEDRIN MIGRAINE) [phone] MG tablet Take 2 tablets by mouth every 6 (six) hours as needed for headache.    Historical Provider, MD  ibuprofen (ADVIL,MOTRIN) 800 MG tablet Take 1 tablet (800 mg total) by mouth every 8 (eight) hours as needed. Patient not taking: Reported on 12/18/2014 07/12/14   III William C Ruffian, PA-C  meloxicam (MOBIC) 7.5 MG tablet Take 1 tablet (7.5 mg total) by mouth daily. 12/14/15 12/13/16  Enid DerryAshley Desirae Mancusi, PA-C  naproxen (NAPROSYN) 500 MG tablet Take 1 tablet (500 mg total) by mouth 2 (two) times daily with a meal. 12/05/15 12/04/16  Ignacia Bayleyobert Tumey, PA-C  oxymetazoline (AFRIN) 0.05 % nasal spray Place 2 sprays into both nostrils 2 (two) times daily. For 3 days Patient not taking:  Reported on 12/18/2014 07/12/14   III Rosalyn GessWilliam C Ruffian, PA-C    Allergies Broccoli [brassica oleracea italica]  No family history on file.  Social History Social History  Substance Use Topics  . Smoking status: Current Some Day Smoker    Packs/day: 0.00    Types: Cigars  . Smokeless tobacco: Not on file  . Alcohol use Yes    Review of Systems Constitutional: No recent illness. Cardiovascular: Denies chest pain or palpitations. Respiratory: Denies shortness of breath. Musculoskeletal: Pain in left knee.  Skin: Negative for rash, wound, lesion. Neurological: Negative for numbness.  ____________________________________________   PHYSICAL EXAM:  VITAL SIGNS: ED Triage Vitals [12/14/15 1530]  Enc Vitals Group     BP 123/65     Pulse Rate 87     Resp 18     Temp 98.6 F (37 C)     Temp Source Oral     SpO2 100 %     Weight 137 lb (62.1 kg)     Height 5\' 11"  (1.803 m)     Head Circumference      Peak Flow      Pain Score 7     Pain Loc      Pain Edu?      Excl. in GC?     Constitutional: Alert and oriented. Well appearing and in no acute distress. Eyes: Conjunctivae are normal.  Head: Atraumatic. Neck: No stridor.  Cardiovascular: Good peripheral pulses.  Respiratory: Normal respiratory effort.   Musculoskeletal: Left knee exam limited due to pain. Limited  ROM of knee. Full ROM of hip, ankle, and toes. Patient tender to palpation on lateral side and posterior area of knee. No tenderness over calf, ankle, or foot.  Neurologic:  Normal speech and language. No gross focal neurologic deficits are appreciated. Sensation intact. Speech is normal.  Skin:  Skin is warm, dry and intact. No bruising. No swelling.  Psychiatric: Mood and affect are normal. Speech and behavior are normal.  ____________________________________________   LABS (all labs ordered are listed, but only abnormal results are displayed)  Labs Reviewed - No data to  display ____________________________________________  RADIOLOGY  I, Enid DerryAshley Mililani Murthy, personally viewed and evaluated these images (plain radiographs) as part of my medical decision making, as well as reviewing the written report by the radiologist.  No acute bony abnormalities per radiology. ____________________________________________   PROCEDURES  INITIAL IMPRESSION / ASSESSMENT AND PLAN / ED COURSE  Clinical Course     Pertinent labs & imaging results that were available during my care of the patient were reviewed by me and considered in my medical decision making (see chart for details).  Patient likely injured a ligament, has a meniscal tear, or strained a muscle. Exam of knee was limited due to pain. No acute bony abnormalities on xray per radiology. Patient was instructed to follow up with PCP next week if symptoms do not improve. Patient was comfortable with this plan. Patient was instructed to use ice, Meloxicam, brace, and crutches as needed.  ____________________________________________   FINAL CLINICAL IMPRESSION(S) / ED DIAGNOSES  Final diagnoses:  Knee injury, initial encounter      Enid Derryshley Reyansh Kushnir, PA-C 12/14/15 1852    Minna AntisKevin Paduchowski, MD 12/14/15 2300

## 2015-12-16 ENCOUNTER — Emergency Department
Admission: EM | Admit: 2015-12-16 | Discharge: 2015-12-16 | Disposition: A | Discharge status: Home / Self Care | Payer: Self-pay | Attending: Emergency Medicine | Admitting: Emergency Medicine

## 2015-12-16 ENCOUNTER — Encounter: Payer: Self-pay | Admitting: Emergency Medicine

## 2015-12-16 DIAGNOSIS — X58XXXA Exposure to other specified factors, initial encounter: Secondary | ICD-10-CM | POA: Insufficient documentation

## 2015-12-16 DIAGNOSIS — Y939 Activity, unspecified: Secondary | ICD-10-CM | POA: Insufficient documentation

## 2015-12-16 DIAGNOSIS — Z7982 Long term (current) use of aspirin: Secondary | ICD-10-CM | POA: Insufficient documentation

## 2015-12-16 DIAGNOSIS — F17290 Nicotine dependence, other tobacco product, uncomplicated: Secondary | ICD-10-CM | POA: Insufficient documentation

## 2015-12-16 DIAGNOSIS — S86912A Strain of unspecified muscle(s) and tendon(s) at lower leg level, left leg, initial encounter: Secondary | ICD-10-CM | POA: Insufficient documentation

## 2015-12-16 DIAGNOSIS — Y929 Unspecified place or not applicable: Secondary | ICD-10-CM | POA: Insufficient documentation

## 2015-12-16 DIAGNOSIS — Y999 Unspecified external cause status: Secondary | ICD-10-CM | POA: Insufficient documentation

## 2015-12-16 MED ORDER — MELOXICAM 15 MG PO TABS
15.0000 mg | ORAL_TABLET | Freq: Every day | ORAL | 0 refills | Status: DC
Start: 2015-12-16 — End: 2018-10-07

## 2015-12-16 NOTE — ED Triage Notes (Signed)
Seen here Saturday for leg pain, unable to get rx filled and unable to walk on leg and go to work

## 2015-12-16 NOTE — ED Provider Notes (Signed)
[facility] Emergency Department Provider Note  ____________________________________________  Time seen: Approximately 5:17 PM  I have reviewed the triage vital signs and the nursing notes.   HISTORY  Chief Complaint Knee Pain    HPI Troy Holmes is a 25 y.o. male who presents to emergency department complaining of knee pain. Patient was seen in this department due to his prior status post injury. Patient states that he was instructed to follow with primary care or call them today and was unable to get an appointment for a month and a half. Patient is requesting follow-up options. Patient also states that he was prescribed medication but was unable to fill it due to an issue with the pharmacy. Patient states that his mother had a prescription for the same medication so he has been taking the medicine but is unable to fill his prescription. Patient states that he has had improvement in pain and is now starting to have a little bit of movement to the affected joint. He denies any reinjury. He denies any increase in pain.   History reviewed. No pertinent past medical history.  There are no active problems to display for this patient.   History reviewed. No pertinent surgical history.  Prior to Admission medications   Medication Sig Start Date End Date Taking? Authorizing Provider  acetaminophen (TYLENOL) 500 MG tablet Take 1,000 mg by mouth every 6 (six) hours as needed for moderate pain.    Historical Provider, MD  aspirin-acetaminophen-caffeine (EXCEDRIN MIGRAINE) [phone] MG tablet Take 2 tablets by mouth every 6 (six) hours as needed for headache.    Historical Provider, MD  ibuprofen (ADVIL,MOTRIN) 800 MG tablet Take 1 tablet (800 mg total) by mouth every 8 (eight) hours as needed. Patient not taking: Reported on 12/18/2014 07/12/14   III William C Ruffian, PA-C  meloxicam (MOBIC) 15 MG tablet Take 1 tablet (15 mg total) by mouth daily. 12/16/15   Delorise RoyalsJonathan D  Tyshon Fanning, PA-C  naproxen (NAPROSYN) 500 MG tablet Take 1 tablet (500 mg total) by mouth 2 (two) times daily with a meal. 12/05/15 12/04/16  Ignacia Bayleyobert Tumey, PA-C  oxymetazoline (AFRIN) 0.05 % nasal spray Place 2 sprays into both nostrils 2 (two) times daily. For 3 days Patient not taking: Reported on 12/18/2014 07/12/14   III Rosalyn GessWilliam C Ruffian, PA-C    Allergies Broccoli [brassica oleracea italica]  No family history on file.  Social History Social History  Substance Use Topics  . Smoking status: Current Some Day Smoker    Packs/day: 0.00    Types: Cigars  . Smokeless tobacco: Not on file  . Alcohol use Yes     Review of Systems  Constitutional: No fever/chills Cardiovascular: no chest pain. Respiratory: no cough. No SOB. Musculoskeletal: Positive for left knee pain Skin: Negative for rash, abrasions, lacerations, ecchymosis. Neurological: Negative for headaches, focal weakness or numbness. 10-point ROS otherwise negative.  ____________________________________________   PHYSICAL EXAM:  VITAL SIGNS: ED Triage Vitals  Enc Vitals Group     BP 12/16/15 1613 (!) 152/74     Pulse Rate 12/16/15 1613 71     Resp 12/16/15 1613 18     Temp 12/16/15 1613 98.6 F (37 C)     Temp Source 12/16/15 1613 Oral     SpO2 12/16/15 1613 100 %     Weight 12/16/15 1614 137 lb (62.1 kg)     Height 12/16/15 1614 5\' 11"  (1.803 m)     Head Circumference --  Peak Flow --      Pain Score 12/16/15 1644 6     Pain Loc --      Pain Edu? --      Excl. in GC? --      Constitutional: Alert and oriented. Well appearing and in no acute distress. Eyes: Conjunctivae are normal. PERRL. EOMI. Head: Atraumatic. Neck: No stridor.    Cardiovascular: Normal rate, regular rhythm. Normal S1 and S2.  Good peripheral circulation. Respiratory: Normal respiratory effort without tachypnea or retractions. Lungs CTAB. Good air entry to the bases with no decreased or absent breath sounds. Musculoskeletal:  Limited range of motion to the left knee. Patient still has knee in brace. This is removed for inspection. Patient has tenderness to palpation over the posterior aspect of the knee. Mild edema is appreciated. No ecchymosis. Lachman's, McMurray's, varus, valgus are negative. Dorsalis is also intact distally. Sensation intact distally. Neurologic:  Normal speech and language. No gross focal neurologic deficits are appreciated.  Skin:  Skin is warm, dry and intact. No rash noted. Psychiatric: Mood and affect are normal. Speech and behavior are normal. Patient exhibits appropriate insight and judgement.   ____________________________________________   LABS (all labs ordered are listed, but only abnormal results are displayed)  Labs Reviewed - No data to display ____________________________________________  EKG   ____________________________________________  RADIOLOGY   X-ray from 12/14/2015 was reviewed on this date.  ____________________________________________    PROCEDURES  Procedure(s) performed:    Procedures    Medications - No data to display   ____________________________________________   INITIAL IMPRESSION / ASSESSMENT AND PLAN / ED COURSE  Pertinent labs & imaging results that were available during my care of the patient were reviewed by me and considered in my medical decision making (see chart for details).  Review of the [place] CSRS was performed in accordance of the [facility] prior to dispensing any controlled drugs.  Clinical Course     Patient's diagnosis is consistent with Left knee strain. Patient likely strained his anterior cruciate ligament from an injury that occurred 2 days prior. Patient returns to the emergency department as he is unable to follow up with his primary care and is still nonweightbearing. Patient also had a problem filling his medication at the pharmacy. Exam today is reassuring and no new imaging as deemed necessary. Patient does have  mild improvement from previous visit. Patient is referred to orthopedics for further evaluation and management. Prescription is represcribed at this time.. Patient is given ED precautions to return to the ED for any worsening or new symptoms.     ____________________________________________  FINAL CLINICAL IMPRESSION(S) / ED DIAGNOSES  Final diagnoses:  Strain of left knee, initial encounter      NEW MEDICATIONS STARTED DURING THIS VISIT:  New Prescriptions   MELOXICAM (MOBIC) 15 MG TABLET    Take 1 tablet (15 mg total) by mouth daily.        This chart was dictated using voice recognition software/Dragon. Despite best efforts to proofread, errors can occur which can change the meaning. Any change was purely unintentional.    Racheal PatchesJonathan D Saja Bartolini, PA-C 12/16/15 1731    Jeanmarie PlantJames A McShane, MD 12/18/15 1131

## 2015-12-16 NOTE — ED Notes (Signed)
Pt was seen here on Saturday for leg injury, pt was unable to get rx filled and wanted new doctors note since unable to walk on it and pain has not gone down.

## 2018-03-12 IMAGING — DX DG KNEE COMPLETE 4+V*L*
4 series · 4 of 4 positions shown · non-contrast
Comparison: None.

CLINICAL DATA: Pt states he was playing basketball and twisted his
left knee. Pt is in extreme pain and is unable to move knee in any
way or bear weight onto left knee. Pain mostly on lateral to
posterior side of left knee.

EXAM:
LEFT KNEE - COMPLETE 4+ VIEW

[knee ap]
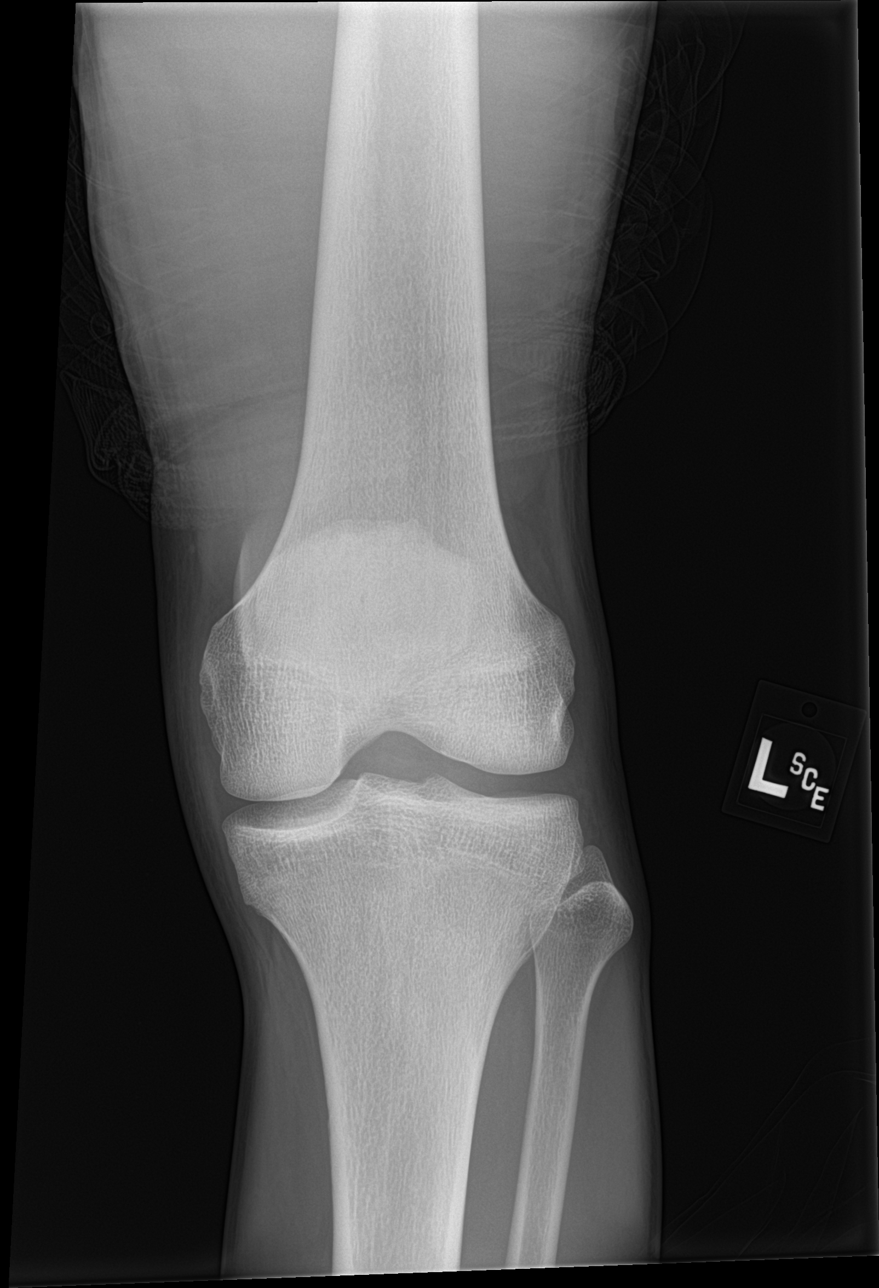

[knee tunnel]
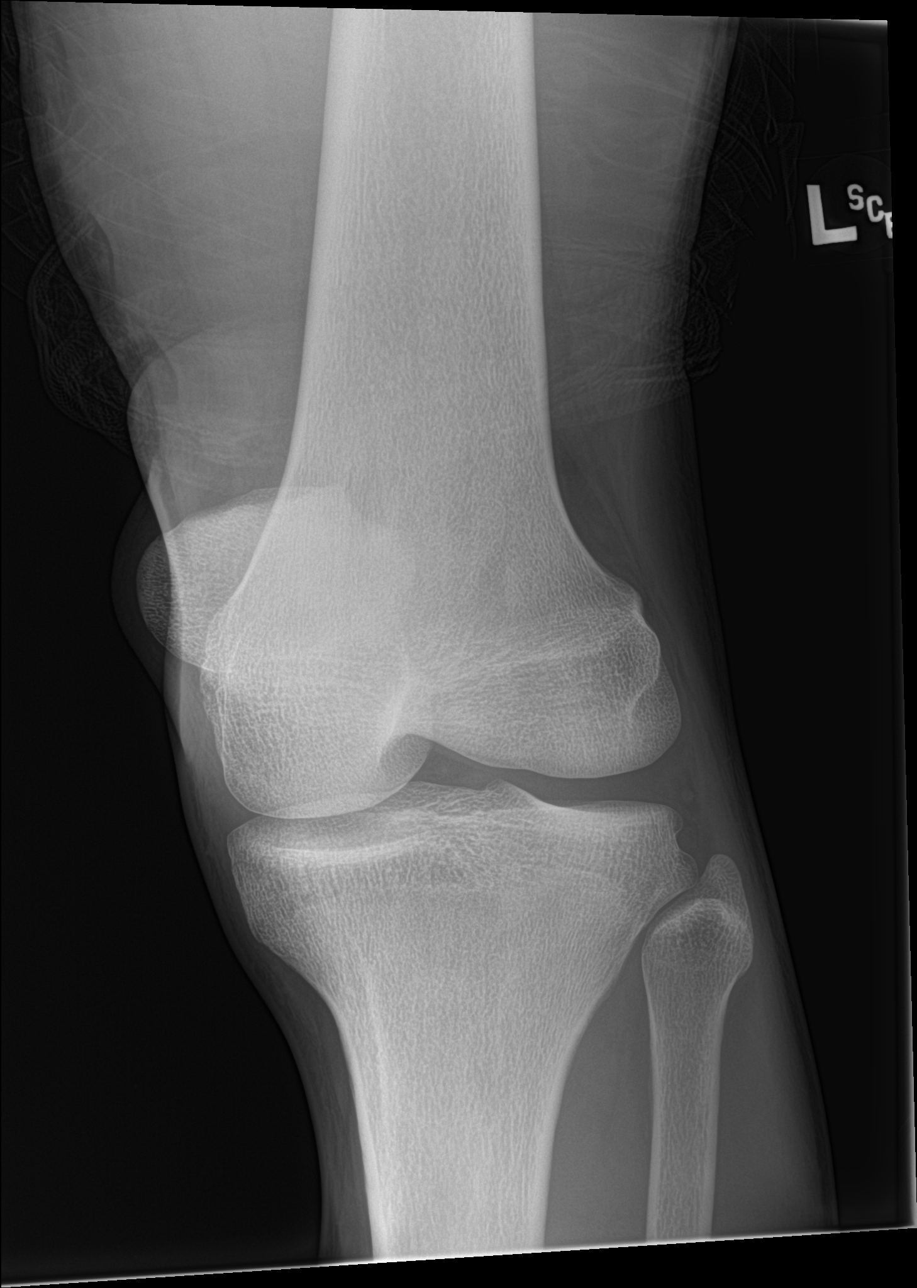

[knee lat]
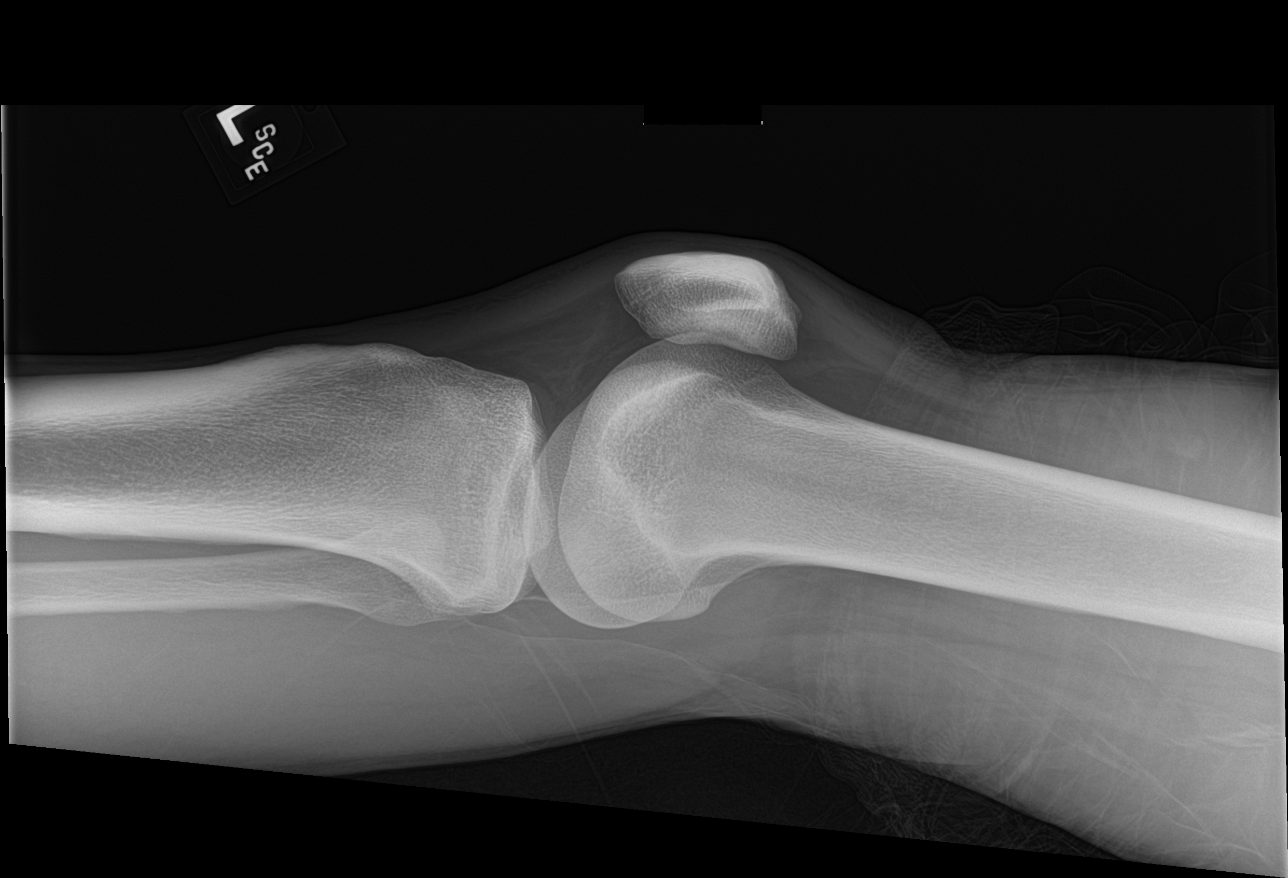

[knee obl]
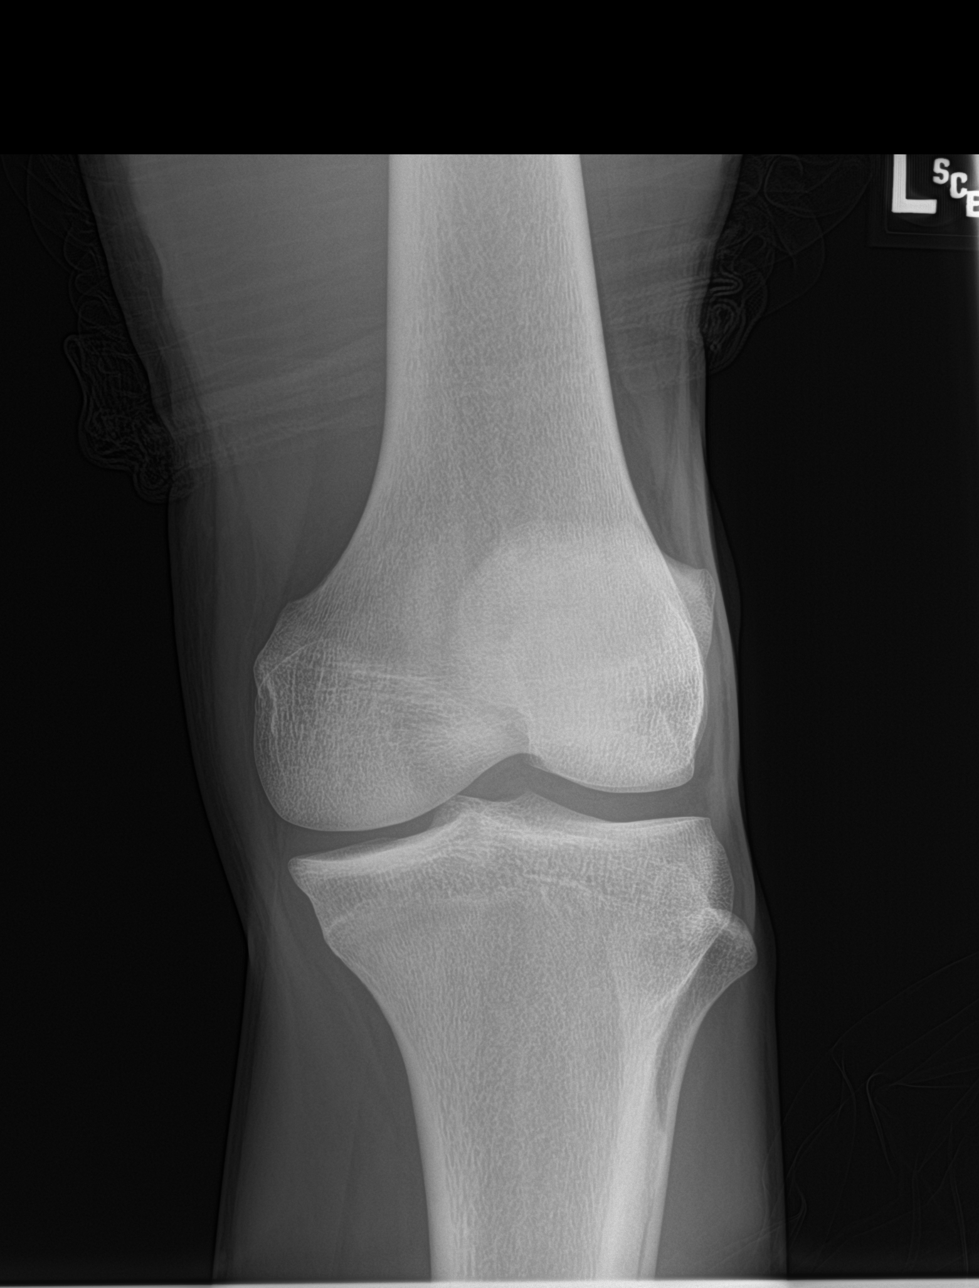

[4 of 4 positions shown; findings below may reference images not displayed]

FINDINGS: No evidence of fracture, dislocation, or joint effusion. No evidence
of arthropathy or other focal bone abnormality. Soft tissues are
unremarkable.
IMPRESSION: Negative.

## 2018-08-12 ENCOUNTER — Emergency Department
Admission: EM | Admit: 2018-08-12 | Discharge: 2018-08-12 | Disposition: A | Discharge status: Home / Self Care | Payer: Self-pay | Attending: Emergency Medicine | Admitting: Emergency Medicine

## 2018-08-12 ENCOUNTER — Other Ambulatory Visit: Payer: Self-pay

## 2018-08-12 DIAGNOSIS — Z7689 Persons encountering health services in other specified circumstances: Secondary | ICD-10-CM | POA: Insufficient documentation

## 2018-08-12 DIAGNOSIS — F17290 Nicotine dependence, other tobacco product, uncomplicated: Secondary | ICD-10-CM | POA: Insufficient documentation

## 2018-08-12 DIAGNOSIS — F1210 Cannabis abuse, uncomplicated: Secondary | ICD-10-CM | POA: Insufficient documentation

## 2018-08-12 DIAGNOSIS — R112 Nausea with vomiting, unspecified: Secondary | ICD-10-CM | POA: Insufficient documentation

## 2018-08-12 NOTE — ED Provider Notes (Signed)
[facility] Emergency Department Provider Note ____________________________________________  Time seen: 2020  I have reviewed the triage vital signs and the nursing notes.  HISTORY  Chief Complaint  needs work note  HPI Troy Holmes is a 28 y.o. male present himself to the ED at the request of his supervisor.  Patient had an episode yesterday of nausea vomiting while at work.  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  He believes he may have been dehydrated and overheated.  He went home, and presented to work today, but was told he needed a doctor's note cleared for return to work.  Patient presents today with no acute complaints.  Denies any ongoing nausea vomiting.  He also denies any fevers, chills, sweats, diarrhea, abdominal pain, or constipation.  Patient denies any high risk exposure or contact the COVID-19.  He is requesting a return to work note at this time.  No past medical history on file.  There are no active problems to display for this patient.   No past surgical history on file.  Prior to Admission medications   Medication Sig Start Date End Date Taking? Authorizing Provider  acetaminophen (TYLENOL) 500 MG tablet Take 1,000 mg by mouth every 6 (six) hours as needed for moderate pain.    [provider]  aspirin-acetaminophen-caffeine (EXCEDRIN MIGRAINE) [phone] MG tablet Take 2 tablets by mouth every 6 (six) hours as needed for headache.    [provider]  ibuprofen (ADVIL,MOTRIN) 800 MG tablet Take 1 tablet (800 mg total) by mouth every 8 (eight) hours as needed. Patient not taking: Reported on 12/18/2014 07/12/14   Carlena Hurl, PA-C  meloxicam (MOBIC) 15 MG tablet Take 1 tablet (15 mg total) by mouth daily. 12/16/15   Cuthriell, Charline Bills, PA-C  oxymetazoline (AFRIN) 0.05 % nasal spray Place 2 sprays into both nostrils 2 (two) times daily. For 3 days Patient  not taking: Reported on 12/18/2014 07/12/14   Carlena Hurl, PA-C    Allergies Broccoli [brassica oleracea italica]  No family history on file.  Social History Social History   Tobacco Use  . Smoking status: Current Some Day Smoker    Packs/day: 0.00    Types: Cigars  Substance Use Topics  . Alcohol use: Yes  . Drug use: Yes    Types: Marijuana    Review of Systems  Constitutional: Negative for fever. Eyes: Negative for visual changes. ENT: Negative for sore throat. Cardiovascular: Negative for chest pain. Respiratory: Negative for shortness of breath. Gastrointestinal: Negative for abdominal pain, vomiting and diarrhea. Genitourinary: Negative for dysuria. Musculoskeletal: Negative for back pain. Skin: Negative for rash. Neurological: Negative for headaches, focal weakness or numbness. ____________________________________________  PHYSICAL EXAM:  VITAL SIGNS: ED Triage Vitals  Enc Vitals Group     BP 08/12/18 2016 116/82     Pulse Rate 08/12/18 2016 (!) 57     Resp 08/12/18 2016 16     Temp 08/12/18 2016 98.9 F (37.2 C)     Temp Source 08/12/18 2016 Oral     SpO2 08/12/18 2016 100 %     Weight 08/12/18 2017 141 lb (64 kg)     Height 08/12/18 2017 5\' 11"  (1.803 m)     Head Circumference --      Peak Flow --      Pain Score 08/12/18 2017 0     Pain Loc --  Pain Edu? --      Excl. in GC? --     Constitutional: Alert and oriented. Well appearing and in no distress. Head: Normocephalic and atraumatic. Eyes: Conjunctivae are normal. Normal extraocular movements Cardiovascular: Normal rate, regular rhythm. Normal distal pulses. Respiratory: Normal respiratory effort. No wheezes/rales/rhonchi. Gastrointestinal: Soft and nontender. No distention. Musculoskeletal: Nontender with normal range of motion in all extremities.  Neurologic:  Normal gait without ataxia. Normal speech and language. No gross focal neurologic deficits are appreciated. Skin:   Skin is warm, dry and intact. No rash noted. Psychiatric: Mood and affect are normal. Patient exhibits appropriate insight and judgment. ____________________________________________  PROCEDURES  Procedures ____________________________________________  INITIAL IMPRESSION / ASSESSMENT AND PLAN / ED COURSE  Troy Holmes was evaluated in Emergency Department on 08/12/2018 for the symptoms described in the history of present illness. He was evaluated in the context of the global COVID-19 pandemic, which necessitated consideration that the patient might be at risk for infection with the SARS-CoV-2 virus that causes COVID-19. Institutional protocols and algorithms that pertain to the evaluation of patients at risk for COVID-19 are in a state of rapid change based on information released by regulatory bodies including the CDC and federal and state organizations. These policies and algorithms were followed during the patient's care in the ED.  Patient with ED evaluation and request for a return to work note.  Patient had a single episode of vomiting while at work yesterday.  She was required to have a return to work note prior to returning.  He denies any complaints or nausea vomiting at this time.  Patient's exam is benign and his vital signs are stable.  He is released to return to work as scheduled. ____________________________________________  FINAL CLINICAL IMPRESSION(S) / ED DIAGNOSES  Final diagnoses:  Return to work evaluation      Karmen StabsMenshew, Charlesetta IvoryJenise V Bacon, PA-C 08/12/18 2136    Sharman CheekStafford, Phillip, MD 08/20/18 [phone]

## 2018-08-12 NOTE — Discharge Instructions (Signed)
You have been evaluated due to an episode of vomiting at work. You report symptoms are resolved. Your exam and vital signs are within normal limits. Return to work as scheduled.

## 2018-08-12 NOTE — ED Triage Notes (Signed)
Pt states he had one episode of emesis yesterday at work and was sent home. Pt states he boss told him he cannot come back to work without a "doctor's note". Pt states he is here for a "doctor's note so I can go back to work". Pt denies any medical problem at this time and appears in no distress.

## 2018-08-12 NOTE — ED Notes (Signed)
Reference triage note. Pt denies any pain at this time. Pt needs work note for clearance to go back to work.

## 2018-10-07 ENCOUNTER — Other Ambulatory Visit: Payer: Self-pay

## 2018-10-07 ENCOUNTER — Encounter: Payer: Self-pay | Admitting: Emergency Medicine

## 2018-10-07 ENCOUNTER — Emergency Department
Admission: EM | Admit: 2018-10-07 | Discharge: 2018-10-07 | Disposition: A | Discharge status: Home / Self Care | Payer: HRSA Program | Attending: Emergency Medicine | Admitting: Emergency Medicine

## 2018-10-07 DIAGNOSIS — F17210 Nicotine dependence, cigarettes, uncomplicated: Secondary | ICD-10-CM | POA: Insufficient documentation

## 2018-10-07 DIAGNOSIS — Z79899 Other long term (current) drug therapy: Secondary | ICD-10-CM | POA: Insufficient documentation

## 2018-10-07 DIAGNOSIS — Z711 Person with feared health complaint in whom no diagnosis is made: Secondary | ICD-10-CM | POA: Diagnosis not present

## 2018-10-07 DIAGNOSIS — Z0279 Encounter for issue of other medical certificate: Secondary | ICD-10-CM | POA: Diagnosis present

## 2018-10-07 DIAGNOSIS — Z20828 Contact with and (suspected) exposure to other viral communicable diseases: Secondary | ICD-10-CM | POA: Diagnosis not present

## 2018-10-07 NOTE — ED Triage Notes (Signed)
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  No illness now. No fever.

## 2018-10-07 NOTE — ED Provider Notes (Signed)
[facility] Emergency Department Provider Note  ____________________________________________   First MD Initiated Contact with Patient 10/07/18 1339     (approximate)  I have reviewed the triage vital signs and the nursing notes.   HISTORY  Chief Complaint Well Child   HPI Troy Holmes is a 28 y.o. male presents to the ED for a work note.  Patient states that he vomited in front of his supervisor yesterday morning and was sent home.  He reports that his supervisor wants him COVID tested before coming back to work.  Patient denies any fever, chills, nausea or vomiting since yesterday morning.  He denies any coughing or respiratory symptoms.  There is been no change in appetite, sense of smell or taste.  He states that he has not been around anyone suspicious for COVID.      History reviewed. No pertinent past medical history.  There are no active problems to display for this patient.   History reviewed. No pertinent surgical history.  Prior to Admission medications   Medication Sig Start Date End Date Taking? Authorizing Provider  acetaminophen (TYLENOL) 500 MG tablet Take 1,000 mg by mouth every 6 (six) hours as needed for moderate pain.    [provider]  aspirin-acetaminophen-caffeine (EXCEDRIN MIGRAINE) [phone] MG tablet Take 2 tablets by mouth every 6 (six) hours as needed for headache.    [provider]    Allergies Eual Fines Marta Lamas oleracea italica]  No family history on file.  Social History Social History   Tobacco Use  . Smoking status: Current Some Day Smoker    Packs/day: 0.00    Types: Cigars  . Smokeless tobacco: Never Used  Substance Use Topics  . Alcohol use: Yes  . Drug use: Yes    Types: Marijuana    Review of Systems Constitutional: No fever/chills Eyes: No visual changes. ENT: No sore throat. Cardiovascular: Denies chest pain. Respiratory: Denies shortness of breath. Gastrointestinal: No  abdominal pain.  No nausea, resolved one episode vomiting.  No diarrhea.  No constipation. Genitourinary: Negative for dysuria. Musculoskeletal: Negative for muscle aches. Skin: Negative for rash. Neurological: Negative for headaches, focal weakness or numbness. ___________________________________________   PHYSICAL EXAM:  VITAL SIGNS: ED Triage Vitals  Enc Vitals Group     BP --      Pulse --      Resp --      Temp --      Temp src --      SpO2 --      Weight 10/07/18 1248 141 lb (64 kg)     Height 10/07/18 1248 5\' 11"  (1.803 m)     Head Circumference --      Peak Flow --      Pain Score 10/07/18 1247 0     Pain Loc --      Pain Edu? --      Excl. in [place]? --    Constitutional: Alert and oriented. Well appearing and in no acute distress. Eyes: Conjunctivae are normal.  Head: Atraumatic. Nose: No congestion/rhinnorhea. Mouth/Throat: Mucous membranes are moist.  Oropharynx non-erythematous.  No tonsillar exudate and uvula is midline. Neck: No stridor.   Hematological/Lymphatic/Immunilogical: No cervical lymphadenopathy. Cardiovascular: Normal rate, regular rhythm. Grossly normal heart sounds.  Good peripheral circulation. Respiratory: Normal respiratory effort.  No retractions. Lungs CTAB. Gastrointestinal: Soft and nontender. No distention. Musculoskeletal: Moves upper and lower extremities without any difficulty normal gait was noted. Neurologic:  Normal speech and language. No gross focal  neurologic deficits are appreciated. No gait instability. Skin:  Skin is warm, dry and intact. No rash noted. Psychiatric: Mood and affect are normal. Speech and behavior are normal.  ____________________________________________   LABS (all labs ordered are listed, but only abnormal results are displayed)  Labs Reviewed  NOVEL CORONAVIRUS, NAA (HOSP ORDER, SEND-OUT TO REF LAB; TAT 18-24 HRS)     PROCEDURES  Procedure(s) performed (including Critical Care):  Procedures    ____________________________________________   INITIAL IMPRESSION / ASSESSMENT AND PLAN / ED COURSE  As part of my medical decision making, I reviewed the following data within the electronic MEDICAL RECORD NUMBER Notes from prior ED visits and [place] Controlled Substance Database    Troy Holmes was evaluated in Emergency Department on 10/07/2018 for the symptoms described in the history of present illness. He was evaluated in the context of the global COVID-19 pandemic, which necessitated consideration that the patient might be at risk for infection with the SARS-CoV-2 virus that causes COVID-19. Institutional protocols and algorithms that pertain to the evaluation of patients at risk for COVID-19 are in a state of rapid change based on information released by regulatory bodies including the CDC and federal and state organizations. These policies and algorithms were followed during the patient's care in the ED.  28 year old male presents to the ED after one episode of vomiting at work yesterday.  Patient denies any other symptoms but states that his boss will not let him return to work until he has a COVID test that says negative.  Does not have any known exposure to COVID.  He states that he has no symptoms at this time and there is been no other GI issues.  Patient was given information about COVID and also a test was done.  ____________________________________________   FINAL CLINICAL IMPRESSION(S) / ED DIAGNOSES  Final diagnoses:  Feared complaint without diagnosis     ED Discharge Orders    None       Note:  This document was prepared using Dragon voice recognition software and may include unintentional dictation errors.    Tommi RumpsSummers, Adaleigh Warf L, PA-C 10/07/18 1504    Sharman CheekStafford, Phillip, MD 10/09/18 [phone]

## 2018-10-07 NOTE — ED Triage Notes (Signed)
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

## 2018-10-07 NOTE — ED Notes (Addendum)
Pt st vomited at work yesterday morning 10:30am. Pt st "I did not eat anything before work, felt nauseous and threw up" Supervior "wants me to get tested before I can go back into work". Pt denies fever, cough, denies being around anyone with suspected COVID.

## 2018-10-07 NOTE — Discharge Instructions (Signed)
Establish a primary care provider with 1 of the clinics listed on your discharge papers.  Also the open-door clinic is available to you.  Your COVID test will take approximately 24 to 48 hours to be received.  You will need to quarantine until you have received these results.  Should the test results be positive you will need to stay out of work another 10 days.  .covid

## 2018-10-08 LAB — NOVEL CORONAVIRUS, NAA (HOSP ORDER, SEND-OUT TO REF LAB; TAT 18-24 HRS): SARS-CoV-2, NAA: NOT DETECTED

## 2019-07-19 ENCOUNTER — Telehealth: Payer: Self-pay | Admitting: General Practice

## 2019-07-19 NOTE — Telephone Encounter (Signed)
Individual has been contacted 3+ times regarding ED referral and has been given information regarding the clinic. No further attempts to contact the individual will be made. 

## 2020-12-03 ENCOUNTER — Other Ambulatory Visit: Payer: Self-pay

## 2020-12-03 ENCOUNTER — Ambulatory Visit: Payer: Self-pay | Admitting: Family Medicine

## 2020-12-03 ENCOUNTER — Encounter: Payer: Self-pay | Admitting: Family Medicine

## 2020-12-03 DIAGNOSIS — Z202 Contact with and (suspected) exposure to infections with a predominantly sexual mode of transmission: Secondary | ICD-10-CM

## 2020-12-03 DIAGNOSIS — N481 Balanitis: Secondary | ICD-10-CM

## 2020-12-03 DIAGNOSIS — Z113 Encounter for screening for infections with a predominantly sexual mode of transmission: Secondary | ICD-10-CM

## 2020-12-03 LAB — GRAM STAIN

## 2020-12-03 LAB — HM HIV SCREENING LAB: HM HIV Screening: NEGATIVE

## 2020-12-03 LAB — HEPATITIS B SURFACE ANTIGEN

## 2020-12-03 LAB — HM HEPATITIS C SCREENING LAB: HM Hepatitis Screen: NEGATIVE

## 2020-12-03 MED ORDER — METRONIDAZOLE 500 MG PO TABS: 500.0000 mg | ORAL_TABLET | Freq: Two times a day (BID) | ORAL | 0 refills | Status: AC

## 2020-12-03 MED ORDER — CLOTRIMAZOLE-BETAMETHASONE 1-0.05 % EX CREA: 1.0000 "application " | TOPICAL_CREAM | Freq: Every day | CUTANEOUS | 0 refills | Status: DC

## 2020-12-03 NOTE — Progress Notes (Signed)
[facility] Department STI clinic/screening visit  Subjective:  Troy Holmes is a 30 y.o. male being seen today for an STI screening visit. The patient reports they do have symptoms.    Patient has the following medical conditions:  There are no problems to display for this patient.    Chief Complaint  Patient presents with   SEXUALLY TRANSMITTED DISEASE    Screening     HPI  Patient reports her for screening, reports s/sx   Does the patient or their partner desires a pregnancy in the next year? Yes  Screening for MPX risk: Does the patient have an unexplained rash? No Is the patient MSM? No Does the patient endorse multiple sex partners or anonymous sex partners? No Did the patient have close or sexual contact with a person diagnosed with MPX? No Has the patient traveled outside the [place] where MPX is endemic? No Is there a high clinical suspicion for MPX-- evidenced by one of the following No  -Unlikely to be chickenpox  -Lymphadenopathy  -Rash that present in same phase of evolution on any given body part   See flowsheet for further details and programmatic requirements.    The following portions of the patient's history were reviewed and updated as appropriate: allergies, current medications, past medical history, past social history, past surgical history and problem list.  Objective:  There were no vitals filed for this visit.  Physical Exam Constitutional:      Appearance: Normal appearance.  HENT:     Head: Normocephalic.     Mouth/Throat:     Mouth: Mucous membranes are moist.     Pharynx: Oropharynx is clear. No oropharyngeal exudate.  Genitourinary:    Penis: Normal.      Testes: Normal.     Comments: No lice, nits, or pest, no lesions or odor discharge.  Denies pain or tenderness with paplation of testicles.  No lesions, ulcers or masses present.  Pt has fungus present around corona of penis once foreskin is pulled back.    Pt is  uncircumcised.    Musculoskeletal:     Cervical back: Normal range of motion.  Lymphadenopathy:     Cervical: No cervical adenopathy.  Skin:    General: Skin is warm and dry.     Findings: No bruising, erythema, lesion or rash.  Neurological:     Mental Status: He is alert.  Psychiatric:        Mood and Affect: Mood normal.        Behavior: Behavior normal.      Assessment and Plan:  Troy Holmes is a 30 y.o. male presenting to the [facility] Department for STI screening  1. Screening examination for venereal disease Patient does have STI symptoms Patient accepted all screenings including  gram stain, urethral, GC and bloodwork for HIV/RPR.  Patient meets criteria for HepB screening? Yes. Ordered? Yes Patient meets criteria for HepC screening? Yes. Ordered? Yes Recommended condom use with all sex Discussed importance of condom use for STI prevent  Discussed time line for State Lab results and that patient will be called with positive results and encouraged patient to call if he had not heard in 2 weeks Recommended returning for continued or worsening symptoms.     - Gonococcus culture - Gram stain - HBV Antigen/Antibody State Lab - HIV/HCV [facility] - Syphilis Serology, [facility]  2. Balanitis  - clotrimazole-betamethasone (LOTRISONE) cream; Apply 1 application topically daily.  Dispense: 30  g; Refill: 0  3. Exposure to trichomonas  - metroNIDAZOLE (FLAGYL) 500 MG tablet; Take 1 tablet (500 mg total) by mouth 2 (two) times daily for 7 days.  Dispense: 14 tablet; Refill: 0   Return for as needed.  No future appointments.  Wendi Snipes, FNP

## 2020-12-07 LAB — GONOCOCCUS CULTURE

## 2020-12-09 NOTE — Progress Notes (Signed)
Chart reviewed by Pharmacist  Suzanne Walker PharmD, Contract Pharmacist at [facility]  

## 2023-03-05 ENCOUNTER — Ambulatory Visit: Payer: Self-pay

## 2023-03-05 NOTE — Telephone Encounter (Signed)
Chief Complaint: See symptoms Symptoms: see below Frequency: two weeks Pertinent Negatives: Patient denies exposure to covid or flu Disposition: [] ED /[] Urgent Care (no appt availability in office) / [x] Appointment(In office/virtual)/ []  [facility]/ [] Home Care/ [] Refused Recommended Disposition /[] [facility] [place] [facility]/ []  Follow-up with PCP Additional Notes: Patient is seeking new patient establishment after being seen in UC today. Patient would like follow up on symptoms of intermittent diarrhea, nausea, vomiting, headache, mild shortness of breath and dizziness. Patient denies feeling like he may pass out. Patient states he was evaluated at urgent care and stated he is not positive for any viral illness. New PCP appt created for Monday.   Copied from CRM [id number]. Topic: Clinical - Red Word Triage >> Mar 05, 2023  4:27 PM Orinda Kenner C wrote: Red Word that prompted transfer to Nurse Triage: Patient's finance Caron Presume [phone] states patient is having nausea, vomiting, stomach pain, diarrhea, fever, lost of appetite, dizziness, and shortness of breath. Patient is trying to set up new pcp with [facility]. Reason for Disposition  [1] MODERATE dizziness (e.g., interferes with normal activities) AND [2] has been evaluated by doctor (or NP/PA) for this  Answer Assessment - Initial Assessment Questions 1. DESCRIPTION: "Describe your dizziness."     When he stands up 2. LIGHTHEADED: "Do you feel lightheaded?" (e.g., somewhat faint, woozy, weak upon standing)     Doesn't feel faint 3. VERTIGO: "Do you feel like either you or the room is spinning or tilting?" (i.e. vertigo)     N/a 4. SEVERITY: "How bad is it?"  "Do you feel like you are going to faint?" "Can you stand and walk?"   - MILD: Feels slightly dizzy, but walking normally.   - MODERATE: Feels unsteady when walking, but not falling; interferes with normal activities (e.g., school, work).   - SEVERE: Unable  to walk without falling, or requires assistance to walk without falling; feels like passing out now.      mild 5. ONSET:  "When did the dizziness begin?"     About two weeks ago, every other day 6. AGGRAVATING FACTORS: "Does anything make it worse?" (e.g., standing, change in head position)     Sometimes light 7. HEART RATE: "Can you tell me your heart rate?" "How many beats in 15 seconds?"  (Note: not all patients can do this)       N/a 8. CAUSE: "What do you think is causing the dizziness?"     Not sure 9. RECURRENT SYMPTOM: "Have you had dizziness before?" If Yes, ask: "When was the last time?" "What happened that time?"     No 10. OTHER SYMPTOMS: "Do you have any other symptoms?" (e.g., fever, chest pain, vomiting, diarrhea, bleeding)       Dizziness, diarrhea, nausea, headache, shortness of breath, chills  Protocols used: Dizziness - Lightheadedness-A-AH

## 2023-03-08 ENCOUNTER — Other Ambulatory Visit: Payer: Self-pay

## 2023-03-08 ENCOUNTER — Encounter: Payer: Self-pay | Admitting: Family Medicine

## 2023-03-08 ENCOUNTER — Ambulatory Visit (INDEPENDENT_AMBULATORY_CARE_PROVIDER_SITE_OTHER): Payer: BC Managed Care – PPO | Admitting: Family Medicine

## 2023-03-08 VITALS — BP 119/78 | HR 58 | Temp 98.2°F | Resp 18 | Ht 71.0 in | Wt 133.0 lb

## 2023-03-08 DIAGNOSIS — Z7689 Persons encountering health services in other specified circumstances: Secondary | ICD-10-CM | POA: Diagnosis not present

## 2023-03-08 DIAGNOSIS — Z0000 Encounter for general adult medical examination without abnormal findings: Secondary | ICD-10-CM

## 2023-03-08 DIAGNOSIS — R42 Dizziness and giddiness: Secondary | ICD-10-CM | POA: Diagnosis not present

## 2023-03-08 NOTE — Patient Instructions (Addendum)
You have a viral infection that will resolve on its own over time.  Symptomatic treatment is ideal at this time. Symptoms will last 3-7 days but can stretch out to 10-14 days. Call if you are not improving by 7-10 days as this may have progressed to a bacterial infection.  You can use nasal saline to help with nasal drainage/congestion. Humidification may also be beneficial.  Unfortunately, antibiotics are not helpful for viral infections.  Vitamin Regimen:  Vitamin C 500mg  twice daily  Vitamin D 5000 units once daily  Zinc 50-75mg  once daily   Over the counter Medications (*unless allergic or contraindicated*):  Use Tylenol (acetaminophen) for fever/pain Use Advil (ibuprofen) for fever/pain/inflammation   Non-Medication Therapy:  Drink plenty of fluids and stay hydrated.  A teaspoon of honey may help ease coughing symptoms.  Cough drops or hard candy for coughing.   Over the Counter Medication Therapy:  Use a cough expectorant such as guaifenesin (Mucinex) if recommended by your doctor for a wet, congested cough. If you have high blood pressure, please ask your doctor first before using this.  Use a cough suppressant such as dextromethorphan (Robitussin/Delsym) for a dry cough. If you have high blood pressure, please ask your doctor first before using this.     MyChart:  For all urgent or time sensitive needs we ask that you please call the office to avoid delays. Our number is [phone]. MyChart is not constantly monitored and due to the large volume of messages a day, replies may take up to 72 business hours.   MyChart Policy: MyChart allows for you to see your visit notes, after visit summary, provider recommendations, lab and tests results, make an appointment, request refills, and contact your provider or the office for non-urgent questions or concerns. Providers are seeing patients during normal business hours and do not have built in time to review MyChart messages.  We ask  that you allow a minimum of 3 business days for responses to [employer]. For this reason, please do not send urgent requests through MyChart. Please call the office at [phone]. New and ongoing conditions may require a visit. We have virtual and in person visit available for your convenience.  Complex MyChart concerns may require a visit. Your provider may request you schedule a virtual or in person visit to ensure we are providing the best care possible. MyChart messages sent after 11:00 AM on Friday will not be received by the provider until Monday morning.    Lab and Test Results: You will receive your lab and test results on MyChart as soon as they are completed and results have been sent by the lab or testing facility. Due to this service, you will receive your results BEFORE your provider.  I review lab and tests results each morning prior to seeing patients. Some results require collaboration with other providers to ensure you are receiving the most appropriate care. For this reason, we ask that you please allow a minimum of 3-5 business days from the time the ALL results have been received for your provider to receive and review lab and test results and contact you about these.  Most lab and test result comments from the provider will be sent through MyChart. Your provider may recommend changes to the plan of care, follow-up visits, repeat testing, ask questions, or request an office visit to discuss these results. You may reply directly to this message or call the office at [phone] to provide information for the provider  or set up an appointment. In some instances, you will be called with test results and recommendations. Please let us know if this is preferred and we will make note of this in your chart to provide this for you.    If you have not heard a response to your lab or test results in 5 business days from all results returning to MyChart, please call the office to let us  know. We ask that you please avoid calling prior to this time unless there is an emergent concern. Due to high call volumes, this can delay the resulting process.   After Hours: For all non-emergency after hours needs, please call the office at [phone] and select the option to reach the on-call provider service. On-call services are shared between multiple [place] offices and therefore it will not be possible to speak directly with your provider. On-call providers may provide medical advice and recommendations, but are unable to provide refills for maintenance medications.  For all emergency or urgent medical needs after normal business hours, we recommend that you seek care at the closest Urgent Care or Emergency Department to ensure appropriate treatment in a timely manner.  [facility] [place] has a 24 hour emergency room located on the ground floor for your convenience.    Urgent Concerns During the Business Day Providers are seeing patients from 8AM to 5PM, Monday through Thursday, and 8AM to 12PM on Friday with a busy schedule and are most often not able to respond to non-urgent calls until the end of the day or the next business day. If you should have URGENT concerns during the day, please call and speak to the nurse or schedule a same day appointment so that we can address your concern without delay.    Thank you, again, for choosing me as your health care partner. I appreciate your trust and look forward to learning more about you.    Alyson Reedy, FNP-C

## 2023-03-08 NOTE — Progress Notes (Unsigned)
New Patient Office Visit  Subjective   Patient ID: Troy Holmes, male    DOB: Oct 29, 1990  Age: 33 y.o. MRN: [id number]  CC:  Chief Complaint  Patient presents with   Dizziness        Nausea   Diarrhea   HPI Troy Holmes is a 33 year old male who presents to establish with [facility] at [facility].   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   Presents at today's visit with complaint of dizziness, nausea, and diarrhea. Emesis episodes x2. Hot/cold episodes.  Symptoms have been present for 3 weeks.  Associated symptoms include: loss of appetite, fever, unintentional weight loss.  Pertinent negatives: shob, cough, sinus pressure/pain, headache  Shortness of breath if he lifts something over his head for more than a minute- also feeling of dizziness.  Treatments tried include: Zofran  Treatment effective: yes, temporarily   Recently went to [facility] on 2/14: diagnosed with viral gastroenteritis  Flu & covid negative  UA- negative    Outpatient Encounter Medications as of 03/08/2023  Medication Sig   ondansetron (ZOFRAN-ODT) 4 MG disintegrating tablet Take 4 mg by mouth every 8 (eight) hours as needed for vomiting or nausea.   [DISCONTINUED] acetaminophen (TYLENOL) 500 MG tablet Take 1,000 mg by mouth every 6 (six) hours as needed for moderate pain. (Patient not taking: Reported on 03/08/2023)   [DISCONTINUED] aspirin-acetaminophen-caffeine (EXCEDRIN MIGRAINE) 250-250-65 MG tablet Take 2 tablets by mouth every 6 (six) hours as needed for headache. (Patient not taking: Reported on 03/08/2023)   [DISCONTINUED] clotrimazole-betamethasone (LOTRISONE) cream Apply 1 application topically daily. (Patient not taking: Reported on 03/08/2023)   No facility-administered encounter medications on file as of 03/08/2023.    There are no active problems to display for this  patient.  History reviewed. No pertinent past medical history. History reviewed. No pertinent surgical history. Family History  Problem Relation Age of Onset   Hypertension Maternal Grandmother    Social History   Socioeconomic History   Marital status: Married    Spouse name: Not on file   Number of children: Not on file   Years of education: Not on file   Highest education level: Not on file  Occupational History   Not on file  Tobacco Use   Smoking status: Every Day    Types: Cigars    Passive exposure: Past   Smokeless tobacco: Never  Vaping Use   Vaping status: Never Used  Substance and Sexual Activity   Alcohol use: Yes    Alcohol/week: 7.0 standard drinks of alcohol    Types: 7 Standard drinks or equivalent per week   Drug use: Yes    Types: Marijuana    Comment: daily   Sexual activity: Yes  Other Topics Concern   Not on file  Social History Narrative   Not on file   Social Drivers of Health   Financial Resource Strain: Not on file  Food Insecurity: Not on file  Transportation Needs: Not on file  Physical Activity: Not on file  Stress: Not on file  Social Connections: Unknown (06/03/2021)   Received from [facility], [facility]   Social Network    Social Network: Not on file  Intimate Partner Violence: Unknown (04/24/2021)   Received from [facility], [facility]   HITS    Physically Hurt: Not on file  Insult or Talk Down To: Not on file    Threaten Physical Harm: Not on file    Scream or Curse: Not on file   Outpatient Medications Prior to Visit  Medication Sig Dispense Refill   ondansetron (ZOFRAN-ODT) 4 MG disintegrating tablet Take 4 mg by mouth every 8 (eight) hours as needed for vomiting or nausea.     acetaminophen (TYLENOL) 500 MG tablet Take 1,000 mg by mouth every 6 (six) hours as needed for moderate pain. (Patient not taking: Reported on 03/08/2023)     aspirin-acetaminophen-caffeine (EXCEDRIN MIGRAINE) 250-250-65 MG tablet Take 2  tablets by mouth every 6 (six) hours as needed for headache. (Patient not taking: Reported on 03/08/2023)     clotrimazole-betamethasone (LOTRISONE) cream Apply 1 application topically daily. (Patient not taking: Reported on 03/08/2023) 30 g 0   No facility-administered medications prior to visit.   Allergies  Allergen Reactions   Broccoli [Brassica Oleracea] Swelling   ROS: see HPI     Objective   Today's Vitals   03/08/23 1425  BP: 119/78  Pulse: (!) 58  Resp: 18  Temp: 98.2 F (36.8 C)  TempSrc: Oral  SpO2: 98%  Weight: 133 lb (60.3 kg)  Height: 5\' 11"  (1.803 m)  PainSc: 0-No pain   GENERAL: Well-appearing, in NAD. Well nourished.  SKIN: Pink, warm and dry. No rash, lesion, ulceration, or ecchymoses.  Head: Normocephalic. NECK: Trachea midline. Full ROM w/o pain or tenderness. No lymphadenopathy.  EARS: Tympanic membranes are intact, translucent without bulging and without drainage. Appropriate landmarks visualized.  EYES: Conjunctiva clear without exudates. EOMI, PERRL, no drainage present.  NOSE: Septum midline w/o deformity. Nares patent, mucosa pink and non-inflamed w/o drainage. No sinus tenderness.  THROAT: Uvula midline. Oropharynx clear. Tonsils non-inflamed without exudate. Mucous membranes pink and moist.  RESPIRATORY: Chest wall symmetrical. Respirations even and non-labored. Breath sounds clear to auscultation bilaterally.  CARDIAC: S1, S2 present, regular rate and rhythm without murmur or gallops. Peripheral pulses 2+ bilaterally.  MSK: Muscle tone and strength appropriate for age. Joints w/o tenderness, redness, or swelling. GI: Abdomen soft w/o distention. Normoactive bowel sounds. No palpable masses or tenderness. No guarding or rebound tenderness. Liver and spleen w/o tenderness or enlargement. No CVA tenderness.   EXTREMITIES: Without clubbing, cyanosis, or edema.  NEUROLOGIC: No motor or sensory deficits. Steady, even gait. C2-C12 intact.  PSYCH/MENTAL  STATUS: Alert, oriented x 3. Cooperative, appropriate mood and affect.     Assessment & Plan:   1. Encounter to establish care (Primary) Patient is a 62- year-old male who presents today to establish care with primary care at [facility]. Reviewed the past medical history, family history, social history, surgical history, medications and allergies today- updates made as indicated. Patient has concerns today about recent diarrhea and dizziness.    2. Dizziness Patient presents today with vague symptoms of recent diarrhea, dizziness, and nausea. He reports that his partner is pregnant and, now that she is feeling better, he is experiencing her symptoms. Denies tachycardia, heart palpitations, syncopal episodes, abdominal pain, intractable vomiting, fever, changes to bowel habits, urinary symptoms, back pain, headache. Provided reassurance to patient that this may be related to recent illness. Advised patient to remain adequately hydrated, take OTC NSAIDs for fever/pain relief, and when to seek emergency care. Patient verbalized an understanding and all questions were answered. Will obtain baseline labs today.   3. Wellness examination Will complete fasting labs today and update patient with results. Plan for annual physical in near future.  -  CBC with Differential/Platelet - Comprehensive metabolic panel - Hemoglobin A1c - Lipid panel - TSH Rfx on Abnormal to Free T4   Return in about 6 weeks (around 04/19/2023) for Physical.   Alyson Reedy, FNP

## 2023-03-09 ENCOUNTER — Encounter: Payer: Self-pay | Admitting: Family Medicine

## 2023-03-09 LAB — CBC WITH DIFFERENTIAL/PLATELET
Basophils Absolute: 0 10*3/uL (ref 0.0–0.2)
Basos: 1 %
EOS (ABSOLUTE): 0.2 10*3/uL (ref 0.0–0.4)
Eos: 5 %
Hematocrit: 40.4 % (ref 37.5–51.0)
Hemoglobin: 13.6 g/dL (ref 13.0–17.7)
Immature Grans (Abs): 0 10*3/uL (ref 0.0–0.1)
Immature Granulocytes: 0 %
Lymphocytes Absolute: 2.3 10*3/uL (ref 0.7–3.1)
Lymphs: 47 %
MCH: 28.6 pg (ref 26.6–33.0)
MCHC: 33.7 g/dL (ref 31.5–35.7)
MCV: 85 fL (ref 79–97)
Monocytes Absolute: 0.3 10*3/uL (ref 0.1–0.9)
Monocytes: 7 %
Neutrophils Absolute: 1.9 10*3/uL (ref 1.4–7.0)
Neutrophils: 40 %
Platelets: 246 10*3/uL (ref 150–450)
RBC: 4.75 x10E6/uL (ref 4.14–5.80)
RDW: 13 % (ref 11.6–15.4)
WBC: 4.8 10*3/uL (ref 3.4–10.8)

## 2023-03-09 LAB — COMPREHENSIVE METABOLIC PANEL
ALT: 15 [IU]/L (ref 0–44)
AST: 17 [IU]/L (ref 0–40)
Albumin: 4.6 g/dL (ref 4.1–5.1)
Alkaline Phosphatase: 60 [IU]/L (ref 44–121)
BUN/Creatinine Ratio: 7 — ABNORMAL LOW (ref 9–20)
BUN: 8 mg/dL (ref 6–20)
Bilirubin Total: 0.9 mg/dL (ref 0.0–1.2)
CO2: 26 mmol/L (ref 20–29)
Calcium: 9.4 mg/dL (ref 8.7–10.2)
Chloride: 102 mmol/L (ref 96–106)
Creatinine, Ser: 1.1 mg/dL (ref 0.76–1.27)
Globulin, Total: 2 g/dL (ref 1.5–4.5)
Glucose: 91 mg/dL (ref 70–99)
Potassium: 4 mmol/L (ref 3.5–5.2)
Sodium: 140 mmol/L (ref 134–144)
Total Protein: 6.6 g/dL (ref 6.0–8.5)
eGFR: 92 mL/min/{1.73_m2} (ref >59)

## 2023-03-09 LAB — LIPID PANEL
Chol/HDL Ratio: 2 {ratio} (ref 0.0–5.0)
Cholesterol, Total: 123 mg/dL (ref 100–199)
HDL: 61 mg/dL (ref >39)
LDL Chol Calc (NIH): 51 mg/dL (ref 0–99)
Triglycerides: 43 mg/dL (ref 0–149)
VLDL Cholesterol Cal: 11 mg/dL (ref 5–40)

## 2023-03-09 LAB — HEMOGLOBIN A1C
Est. average glucose Bld gHb Est-mCnc: 117 mg/dL
Hgb A1c MFr Bld: 5.7 % — ABNORMAL HIGH (ref 4.8–5.6)

## 2023-03-09 LAB — TSH RFX ON ABNORMAL TO FREE T4: TSH: 0.927 u[IU]/mL (ref 0.450–4.500)

## 2023-04-20 ENCOUNTER — Ambulatory Visit (INDEPENDENT_AMBULATORY_CARE_PROVIDER_SITE_OTHER): Payer: BC Managed Care – PPO | Admitting: Family Medicine

## 2023-04-20 ENCOUNTER — Other Ambulatory Visit (HOSPITAL_COMMUNITY)
Admission: RE | Admit: 2023-04-20 | Discharge: 2023-04-20 | Disposition: A | Discharge status: Home / Self Care | Source: Ambulatory Visit | Attending: Family Medicine | Admitting: Family Medicine

## 2023-04-20 ENCOUNTER — Encounter: Payer: Self-pay | Admitting: Family Medicine

## 2023-04-20 VITALS — BP 110/73 | HR 73 | Temp 98.4°F | Resp 16 | Ht 69.29 in | Wt 134.0 lb

## 2023-04-20 DIAGNOSIS — M79671 Pain in right foot: Secondary | ICD-10-CM

## 2023-04-20 DIAGNOSIS — Z0000 Encounter for general adult medical examination without abnormal findings: Secondary | ICD-10-CM | POA: Diagnosis not present

## 2023-04-20 DIAGNOSIS — Z113 Encounter for screening for infections with a predominantly sexual mode of transmission: Secondary | ICD-10-CM | POA: Insufficient documentation

## 2023-04-20 NOTE — Progress Notes (Signed)
 Subjective:   Troy Holmes November 27, 1990 04/20/2023  CC: Chief Complaint  Patient presents with   Medical Management of Chronic Issues    Pt. Here for a physical.      HPI: Troy Holmes is a 33 y.o. male who presents for a routine health maintenance exam.  Fasting labs collected at previous visit- results discussed today.   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 Must have 20 pack year history.   Depression and Anxiety Screen done today and results listed below:     04/20/2023    1:29 PM 03/08/2023    3:24 PM  Depression screen PHQ 2/9  Decreased Interest 2 2  Down, Depressed, Hopeless 0 1  PHQ - 2 Score 2 3  Altered sleeping 0 1  Tired, decreased energy 1 1  Change in appetite 0 1  Feeling bad or failure about yourself  0 1  Trouble concentrating 0 1  Moving slowly or fidgety/restless 0 2  Suicidal thoughts 0 0  PHQ-9 Score 3 10  Difficult doing work/chores Not difficult at all Somewhat difficult      04/20/2023    1:30 PM 03/08/2023    3:24 PM  GAD 7 : Generalized Anxiety Score  Nervous, Anxious, on Edge 1 1  Control/stop worrying 0 2  Worry too much - different things 1 2  Trouble relaxing 1 2  Restless 1 1  Easily annoyed or irritable 0 2  Afraid - awful might happen 0 1  Total GAD 7 Score 4 11  Anxiety Difficulty Not difficult at all Somewhat difficult     IMMUNIZATIONS:  - Tdap: Tetanus vaccination status reviewed: declines. - Influenza: Postponed to flu season - Pneumovax: Not applicable - Prevnar: Not applicable - Shingrix vaccine (50+): Not applicable   Past medical history, surgical history, medications, allergies, family history and social history reviewed with patient today and changes made to appropriate areas of the chart.   History reviewed. No pertinent past medical history.  History reviewed. No pertinent surgical history.  No current outpatient medications on file prior to visit.   No current facility-administered medications on file prior to visit.    Allergies  Allergen Reactions   Broccoli [Brassica Oleracea] Swelling     Social History   Socioeconomic History   Marital status: Married    Spouse name: Not on file   Number of children: Not on file   Years of education: Not on file   Highest education level: Not on file  Occupational History   Not on file  Tobacco Use   Smoking status: Every Day    Types: Cigars    Passive exposure: Past   Smokeless tobacco: Never  Vaping Use   Vaping status: Never Used  Substance and Sexual Activity   Alcohol use: Yes    Alcohol/week: 7.0 standard drinks of alcohol  Types: 7 Standard drinks or equivalent per week   Drug use: Yes    Types: Marijuana    Comment: daily   Sexual activity: Yes  Other Topics Concern   Not on file  Social History Narrative   Not on file   Social Drivers of Health   Financial Resource Strain: Not on file  Food Insecurity: Not on file  Transportation Needs: Not on file  Physical Activity: Not on file  Stress: Not on file  Social Connections: Unknown (06/03/2021)   Received from [facility], [facility]   Social Network    Social Network: Not on file  Intimate Partner Violence: Unknown (04/24/2021)   Received from [employer], [facility]   HITS    Physically Hurt: Not on file    Insult or Talk Down To: Not on file     Threaten Physical Harm: Not on file    Scream or Curse: Not on file   Social History   Tobacco Use  Smoking Status Every Day   Types: Cigars   Passive exposure: Past  Smokeless Tobacco Never   Social History   Substance and Sexual Activity  Alcohol Use Yes   Alcohol/week: 7.0 standard drinks of alcohol   Types: 7 Standard drinks or equivalent per week    Family History  Problem Relation Age of Onset   Hypertension Maternal Grandmother    ROS: Denies fever, fatigue, unexplained weight loss/gain, CP, SHOB, and palpatitations. Denies neurological deficits, gastrointestinal and/or genitourinary complaints, and skin changes.   Objective:   Today's Vitals   04/20/23 1325  BP: 110/73  Pulse: 73  Resp: 16  Temp: 98.4 F (36.9 C)  TempSrc: Oral  SpO2: 98%  Weight: 134 lb (60.8 kg)  Height: 5' 9.29" (1.76 m)  PainSc: 0-No pain    GENERAL APPEARANCE: Well-appearing, in NAD. Well nourished.  SKIN: Pink, warm and dry. Turgor normal. No rash, lesion, ulceration, or ecchymoses. Hair evenly distributed.  HEENT: HEAD: Normocephalic.  EYES: PERRLA. EOMI. Lids intact w/o defect. Sclera white, Conjunctiva pink w/o exudate.  EARS: External ear w/o redness, swelling, masses or lesions. EAC clear. TM's intact, translucent w/o bulging, appropriate landmarks visualized. Appropriate acuity to conversational tones.  NOSE: Septum midline w/o deformity. Nares patent, mucosa pink and non-inflamed w/o drainage. No sinus tenderness.  THROAT: Uvula midline. Oropharynx clear. Tonsils non-inflamed w/o exudate. Oral mucosa pink and moist.  NECK: Supple, Trachea midline. Full ROM w/o pain or tenderness. No lymphadenopathy. Thyroid non-tender w/o enlargement or palpable masses.  RESPIRATORY: Chest wall symmetrical w/o masses. Respirations even and non-labored. Breath sounds clear to auscultation bilaterally. No wheezes, rales, rhonchi, or crackles. CARDIAC: S1, S2 present, regular rate and rhythm.  No gallops, murmurs, rubs, or clicks. PMI w/o lifts, heaves, or thrills. No carotid bruits. Capillary refill <2 seconds. Peripheral pulses 2+ bilaterally. GI: Abdomen soft w/o distention. Normoactive bowel sounds. No palpable masses or tenderness. No guarding or rebound tenderness. Liver and spleen w/o tenderness or enlargement. No CVA tenderness.  GU:  Deferred exam. MSK: Muscle tone and strength appropriate for age, w/o atrophy or abnormal movement. EXTREMITIES: Active ROM intact, w/o tenderness, crepitus, or contracture. No obvious joint deformities or effusions. No clubbing, edema, or cyanosis.  NEUROLOGIC: CN's II-XII intact. Motor strength symmetrical with no obvious weakness. No sensory deficits. DTR 2+ symmetric bilaterally. Steady, even gait.  PSYCH/MENTAL STATUS: Alert, oriented x 3. Cooperative, appropriate mood and affect.   Results for orders placed or performed in visit on 03/08/23  CBC  with Differential/Platelet   Collection Time: 03/08/23  2:54 PM  Result Value Ref Range   WBC 4.8 3.4 - 10.8 x10E3/uL   RBC 4.75 4.14 - 5.80 x10E6/uL   Hemoglobin 13.6 13.0 - 17.7 g/dL   Hematocrit 16.1 09.6 - 51.0 %   MCV 85 79 - 97 fL   MCH 28.6 26.6 - 33.0 pg   MCHC 33.7 31.5 - 35.7 g/dL   RDW 04.5 40.9 - 81.1 %   Platelets 246 150 - 450 x10E3/uL   Neutrophils 40 Not Estab. %   Lymphs 47 Not Estab. %   Monocytes 7 Not Estab. %   Eos 5 Not Estab. %   Basos 1 Not Estab. %   Neutrophils Absolute 1.9 1.4 - 7.0 x10E3/uL   Lymphocytes Absolute 2.3 0.7 - 3.1 x10E3/uL   Monocytes Absolute 0.3 0.1 - 0.9 x10E3/uL   EOS (ABSOLUTE) 0.2 0.0 - 0.4 x10E3/uL   Basophils Absolute 0.0 0.0 - 0.2 x10E3/uL   Immature Granulocytes 0 Not Estab. %   Immature Grans (Abs) 0.0 0.0 - 0.1 x10E3/uL  Comprehensive metabolic panel   Collection Time: 03/08/23  2:54 PM  Result Value Ref Range   Glucose 91 70 - 99 mg/dL   BUN 8 6 - 20 mg/dL   Creatinine, Ser 9.14 0.76 - 1.27 mg/dL   eGFR 92 >78 GN/FAO/1.30    BUN/Creatinine Ratio 7 (L) 9 - 20   Sodium 140 134 - 144 mmol/L   Potassium 4.0 3.5 - 5.2 mmol/L   Chloride 102 96 - 106 mmol/L   CO2 26 20 - 29 mmol/L   Calcium 9.4 8.7 - 10.2 mg/dL   Total Protein 6.6 6.0 - 8.5 g/dL   Albumin 4.6 4.1 - 5.1 g/dL   Globulin, Total 2.0 1.5 - 4.5 g/dL   Bilirubin Total 0.9 0.0 - 1.2 mg/dL   Alkaline Phosphatase 60 44 - 121 IU/L   AST 17 0 - 40 IU/L   ALT 15 0 - 44 IU/L  Hemoglobin A1c   Collection Time: 03/08/23  2:54 PM  Result Value Ref Range   Hgb A1c MFr Bld 5.7 (H) 4.8 - 5.6 %   Est. average glucose Bld gHb Est-mCnc 117 mg/dL  Lipid panel   Collection Time: 03/08/23  2:54 PM  Result Value Ref Range   Cholesterol, Total 123 100 - 199 mg/dL   Triglycerides 43 0 - 149 mg/dL   HDL 61 >86 mg/dL   VLDL Cholesterol Cal 11 5 - 40 mg/dL   LDL Chol Calc (NIH) 51 0 - 99 mg/dL   Chol/HDL Ratio 2.0 0.0 - 5.0 ratio  TSH Rfx on Abnormal to Free T4   Collection Time: 03/08/23  2:54 PM  Result Value Ref Range   TSH 0.927 0.450 - 4.500 uIU/mL    Assessment & Plan:   1. Wellness examination (Primary) - Encouraged to adjust caloric intake to maintain or achieve ideal body weight, to reduce intake of dietary saturated fat and total fat, to limit sodium intake by avoiding high sodium foods and not adding table salt, and to maintain adequate dietary potassium and calcium preferably from fresh fruits, vegetables, and low-fat dairy products.   - Advised to avoid cigarette smoking. - Discussed with the patient that most people either abstain from alcohol or drink within safe limits (<=14/week and <=4 drinks/occasion for males, <=7/weeks and <= 3 drinks/occasion for females) and that the risk for alcohol disorders and other health effects rises proportionally with the number of  drinks per week and how often a drinker exceeds daily limits. - Discussed cessation/primary prevention of drug use and availability of treatment for abuse.  - Stressed the importance of  regular exercise - Injury prevention: Discussed safety belts, safety helmets, smoke detector, smoking near bedding or upholstery.  - Dental health: Discussed importance of regular tooth brushing, flossing, and dental visits.  - Sexuality: Discussed sexually transmitted diseases, partner selection, use of condoms, avoidance of unintended pregnancy  and contraceptive alternatives.   2. Screen for sexually transmitted diseases Patient would like to complete only urine STI testing today.  - Urine cytology ancillary only  3. Right foot pain Referral placed for orthopedic evaluation due to ongoing R foot pain.  - Ambulatory referral to Orthopedic Surgery  NEXT PREVENTATIVE PHYSICAL DUE IN 1 YEAR.  Return in about 1 year (around 04/19/2024) for Physical with fasting labs.  Patient to reach out to office if new, worrisome, or unresolved symptoms arise or if no improvement in patient's condition. Patient verbalized understanding and is agreeable to treatment plan. All questions answered to patient's satisfaction.    Alyson Reedy, FNP

## 2023-04-20 NOTE — Patient Instructions (Signed)
 Health Maintenance Recommendations Screening Testing Mammogram Every 1 -2 years based on history and risk factors Starting at age 31 Pap Smear Ages 21-39 every 3 years Ages 23-65 every 5 years with HPV testing More frequent testing may be required based on results and history Colon Cancer Screening Every 1-10 years based on test performed, risk factors, and history Starting at age 102 Bone Density Screening Every 2-10 years based on history Starting at age 69 for women Recommendations for men differ based on medication usage, history, and risk factors AAA Screening One time ultrasound Men 30-10 years old who have every smoked Lung Cancer Screening Low Dose Lung CT every 12 months Age 20-80 years with a 30 pack-year smoking history who still smoke or who have quit within the last 15 years   Screening Labs Routine  Labs: Complete Blood Count (CBC), Complete Metabolic Panel (CMP), Cholesterol (Lipid Panel) Every 6-12 months based on history and medications May be recommended more frequently based on current conditions or previous results Hemoglobin A1c Lab Every 3-12 months based on history and previous results Starting at age 24 or earlier with diagnosis of diabetes, high cholesterol, BMI >26, and/or risk factors Frequent monitoring for patients with diabetes to ensure blood sugar control Thyroid Panel (TSH w/ T3 & T4) Every 6 months based on history, symptoms, and risk factors May be repeated more often if on medication HIV One time testing for all patients 23 and older May be repeated more frequently for patients with increased risk factors or exposure Hepatitis C One time testing for all patients 47 and older May be repeated more frequently for patients with increased risk factors or exposure Gonorrhea, Chlamydia Every 12 months for all sexually active persons 13-24 years Additional monitoring may be recommended for those who are considered high risk or who have  symptoms PSA Men 72-66 years old with risk factors Additional screening may be recommended from age 2-69 based on risk factors, symptoms, and history   Vaccine Recommendations Tetanus Booster All adults every 10 years Flu Vaccine All patients 6 months and older every year COVID Vaccine All patients 12 years and older Initial dosing with booster May recommend additional booster based on age and health history HPV Vaccine 2 doses all patients age 56-26 Dosing may be considered for patients over 26 Shingles Vaccine (Shingrix) 2 doses all adults 55 years and older Pneumonia (Pneumovax 23) All adults 65 years and older May recommend earlier dosing based on health history Pneumonia (Prevnar 16) All adults 65 years and older Dosed 1 year after Pneumovax 23   Additional Screening, Testing, and Vaccinations may be recommended on an individualized basis based on family history, health history, risk factors, and/or exposure.  __________________________________________________________   Diet Recommendations for All Patients   I recommend that all patients maintain a diet low in saturated fats, carbohydrates, and cholesterol. While this can be challenging at first, it is not impossible and small changes can make big differences.  Things to try: Decreasing the amount of soda, sweet tea, and/or juice to one or less per day and replace with water While water is always the first choice, if you do not like water you may consider adding a water additive without sugar to improve the taste other sugar free drinks Replace potatoes with a brightly colored vegetable at dinner Use healthy oils, such as canola oil or olive oil, instead of butter or hard margarine Limit your bread intake to two pieces or less a day Replace regular pasta with  low carb pasta options Bake, broil, or grill foods instead of frying Monitor portion sizes  Eat smaller, more frequent meals throughout the day instead of large  meals   An important thing to remember is, if you love foods that are not great for your health, you don't have to give them up completely. Instead, allow these foods to be a reward when you have done well. Allowing yourself to still have special treats every once in a while is a nice way to tell yourself thank you for working hard to keep yourself healthy.    Also remember that every day is a new day. If you have a bad day and "fall off the wagon", you can still climb right back up and keep moving along on your journey!   We have resources available to help you!  Some websites that may be helpful include: www.http://www.wall-moore.info/        Www.VeryWellFit.com _____________________________________________________________   Activity Recommendations for All Patients   I recommend that all adults get at least 20 minutes of moderate physical activity that elevates your heart rate at least 5 days out of the week.  Some examples include: Walking or jogging at a pace that allows you to carry on a conversation Cycling (stationary bike or outdoors) Water aerobics Yoga Weight lifting Dancing If physical limitations prevent you from putting stress on your joints, exercise in a pool or seated in a chair are excellent options.   Do determine your MAXIMUM heart rate for activity: YOUR AGE - 220 = MAX HeartRate    Remember! Do not push yourself too hard.  Start slowly and build up your pace, speed, weight, time in exercise, etc.  Allow your body to rest between exercise and get good sleep. You will need more water than normal when you are exerting yourself. Do not wait until you are thirsty to drink. Drink with a purpose of getting in at least 8, 8 ounce glasses of water a day plus more depending on how much you exercise and sweat.      If you begin to develop dizziness, chest pain, abdominal pain, jaw pain, shortness of breath, headache, vision changes, lightheadedness, or other concerning symptoms, stop the  activity and allow your body to rest. If your symptoms are severe, seek emergency evaluation immediately. If your symptoms are concerning, but not severe, please let us know so that we can recommend further evaluation.

## 2023-04-21 LAB — URINE CYTOLOGY ANCILLARY ONLY
Chlamydia: NEGATIVE
Comment: NEGATIVE
Comment: NEGATIVE
Comment: NORMAL
Neisseria Gonorrhea: NEGATIVE
Trichomonas: NEGATIVE

## 2023-04-29 ENCOUNTER — Encounter: Payer: Self-pay | Admitting: Family Medicine

## 2023-05-09 ENCOUNTER — Other Ambulatory Visit: Payer: Self-pay

## 2023-05-09 ENCOUNTER — Emergency Department: Payer: Worker's Compensation

## 2023-05-09 ENCOUNTER — Emergency Department
Admission: EM | Admit: 2023-05-09 | Discharge: 2023-05-09 | Disposition: A | Discharge status: Home / Self Care | Payer: Worker's Compensation

## 2023-05-09 DIAGNOSIS — Y990 Civilian activity done for income or pay: Secondary | ICD-10-CM | POA: Diagnosis not present

## 2023-05-09 DIAGNOSIS — W208XXA Other cause of strike by thrown, projected or falling object, initial encounter: Secondary | ICD-10-CM | POA: Diagnosis not present

## 2023-05-09 DIAGNOSIS — S60221A Contusion of right hand, initial encounter: Secondary | ICD-10-CM | POA: Diagnosis not present

## 2023-05-09 DIAGNOSIS — S6991XA Unspecified injury of right wrist, hand and finger(s), initial encounter: Secondary | ICD-10-CM | POA: Diagnosis present

## 2023-05-09 NOTE — ED Provider Notes (Signed)
 [facility] Emergency Department Provider Note     Event Date/Time   First MD Initiated Contact with Patient 05/09/23 1527     (approximate)   History   right thumb injury   HPI  Troy Holmes is a 33 y.o. male with a noncontributory medical history, presents to the ED endorsing right thumb injury.  He reports a work-related injury that occurred yesterday.  By his report a pallet fell about 4 inches landed on the patient's right thumb and palm.  He presents to the ED today for evaluation of his work-related injury.  He endorses active range of motion but notes pain.  No other injury reported at this time.  Physical Exam   Triage Vital Signs: ED Triage Vitals  Encounter Vitals Group     BP 05/09/23 1516 124/69     Systolic BP Percentile --      Diastolic BP Percentile --      Pulse Rate 05/09/23 1516 (!) 59     Resp 05/09/23 1516 15     Temp 05/09/23 1516 98.4 F (36.9 C)     Temp Source 05/09/23 1516 Oral     SpO2 05/09/23 1516 98 %     Weight 05/09/23 1517 138 lb (62.6 kg)     Height 05/09/23 1517 5\' 10"  (1.778 m)     Head Circumference --      Peak Flow --      Pain Score 05/09/23 1516 3     Pain Loc --      Pain Education --      Exclude from Growth Chart --     Most recent vital signs: Vitals:   05/09/23 1516  BP: 124/69  Pulse: (!) 59  Resp: 15  Temp: 98.4 F (36.9 C)  SpO2: 98%    General Awake, no distress. NAD HEENT NCAT. PERRL. EOMI. No rhinorrhea. Mucous membranes are moist.  CV:  Good peripheral perfusion.  RESP:  Normal effort.  ABD:  No distention.  MSK:  Normal composite fist on the right.  No deformity, joint effusion, ecchymosis, abrasion, or edema appreciated.  Mildly tender palpation to the right thenar prominence. NEURO: Cranial nerves II to XII grossly intact.  Normal gross sensation.  Normal intrinsic and opposition testing noted.   ED Results / Procedures / Treatments   Labs (all labs ordered are listed,  but only abnormal results are displayed) Labs Reviewed - No data to display   EKG   RADIOLOGY  I personally viewed and evaluated these images as part of my medical decision making, as well as reviewing the written report by the radiologist.  ED Provider Interpretation: No acute bony injury  DG Right Thumb  IMPRESSION: No acute fracture or dislocation.   PROCEDURES:  Critical Care performed: No  Procedures   MEDICATIONS ORDERED IN ED: Medications - No data to display   IMPRESSION / MDM / ASSESSMENT AND PLAN / ED COURSE  I reviewed the triage vital signs and the nursing notes.                              Differential diagnosis includes, but is not limited to, injury, nail avulsion, subungual hemorrhage, fracture, dislocation  Patient's presentation is most consistent with acute complicated illness / injury requiring diagnostic workup.  Patient's diagnosis is consistent with right hand contusion.  No radiologic evidence of any acute fracture or dislocation based on interpretation  of images.  Patient returns and workup at this time.  Likely consistent with contusion.  Patient will be discharged home with instructions to take OTC Tylenol  or Motrin  as needed. Patient is to follow up [facility] employee health as discussed, as needed or otherwise directed. Patient is given ED precautions to return to the ED for any worsening or new symptoms.   FINAL CLINICAL IMPRESSION(S) / ED DIAGNOSES   Final diagnoses:  Contusion of right hand, initial encounter     Rx / DC Orders   ED Discharge Orders     None        Note:  This document was prepared using Dragon voice recognition software and may include unintentional dictation errors.    May Sparks, PA-C 05/10/23 2348    Collis Deaner, MD 05/11/23 [phone]

## 2023-05-09 NOTE — ED Triage Notes (Signed)
 Pt arrives via POV with c/o right thumb injury. Injury happened yesterday when a pallet fell about 4" onto pt's right thumb. 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 This is a worker's comp visit.

## 2023-05-09 NOTE — Discharge Instructions (Signed)
 Your exam and x-ray are negative for any acute fracture or dislocation of the hand or thumb.  You may experience some tightness and stiffness over the next few days.  Apply ice to reduce any swelling.  Take OTC ibuprofen  for pain and inflammation.  Follow-up with [facility] for return to work evaluation.

## 2023-05-09 NOTE — ED Notes (Signed)
 See triage notes. Patient stated a pallet fell on his thumb while at work.

## 2023-06-21 ENCOUNTER — Emergency Department
Admission: EM | Admit: 2023-06-21 | Discharge: 2023-06-21 | Disposition: A | Discharge status: Home / Self Care | Attending: Emergency Medicine | Admitting: Emergency Medicine

## 2023-06-21 ENCOUNTER — Encounter: Payer: Self-pay | Admitting: Emergency Medicine

## 2023-06-21 ENCOUNTER — Emergency Department

## 2023-06-21 ENCOUNTER — Other Ambulatory Visit: Payer: Self-pay

## 2023-06-21 DIAGNOSIS — B349 Viral infection, unspecified: Secondary | ICD-10-CM | POA: Insufficient documentation

## 2023-06-21 DIAGNOSIS — R0602 Shortness of breath: Secondary | ICD-10-CM | POA: Diagnosis present

## 2023-06-21 MED ORDER — ACETAMINOPHEN 325 MG PO TABS
650.0000 mg | ORAL_TABLET | Freq: Once | ORAL | Status: AC
  Administered 2023-06-21: 650 mg via ORAL
  Filled 2023-06-21: qty 2

## 2023-06-21 NOTE — ED Triage Notes (Signed)
 Pt arrives c/o sob, headache, congestion, and one episode of emesis yesterday. Other s/s present since Thursday, denies fever or sick contact. NADN.

## 2023-06-21 NOTE — ED Provider Notes (Signed)
 [facility] Provider Note    Event Date/Time   First MD Initiated Contact with Patient 06/21/23 [phone]     (approximate)   History   Shortness of Breath   HPI  Troy Holmes is a 33 y.o. male with no significant past medical history who presents with shortness of breath for the last 3 days, persistent course, associated with bodyaches including lower back pain, headache, some chills, nasal congestion, and 1 episode of vomiting.  He denies cough or fever.  He has no diarrhea.  I reviewed the past medical records.  The patient's most recent outpatient encounter was on 4/1 with primary care for routine health maintenance visit.   Physical Exam   Triage Vital Signs: ED Triage Vitals  Encounter Vitals Group     BP 06/21/23 0421 122/80     Systolic BP Percentile --      Diastolic BP Percentile --      Pulse Rate 06/21/23 0421 64     Resp 06/21/23 0421 18     Temp 06/21/23 0421 98.3 F (36.8 C)     Temp Source 06/21/23 0421 Oral     SpO2 06/21/23 0421 100 %     Weight 06/21/23 0422 143 lb (64.9 kg)     Height 06/21/23 0422 5\' 11"  (1.803 m)     Head Circumference --      Peak Flow --      Pain Score 06/21/23 0422 4     Pain Loc --      Pain Education --      Exclude from Growth Chart --     Most recent vital signs: Vitals:   06/21/23 0421  BP: 122/80  Pulse: 64  Resp: 18  Temp: 98.3 F (36.8 C)  SpO2: 100%     General: Alert, well-appearing, no distress.  CV:  Good peripheral perfusion.  Resp:  Normal effort.  Lungs CTAB. Abd:  No distention.  Other:  Oropharynx clear.   ED Results / Procedures / Treatments   Labs (all labs ordered are listed, but only abnormal results are displayed) Labs Reviewed - No data to display   EKG     RADIOLOGY  Chest x-ray: I independently viewed and interpreted the images; there is no focal consolidation or edema   PROCEDURES:  Critical Care performed: No  Procedures   MEDICATIONS ORDERED  IN ED: Medications  acetaminophen  (TYLENOL ) tablet 650 mg (650 mg Oral Given 06/21/23 0442)     IMPRESSION / MDM / ASSESSMENT AND PLAN / ED COURSE  I reviewed the triage vital signs and the nursing notes.  Differential diagnosis includes, but is not limited to, viral syndrome, bronchospasm, reactive airways, pneumonia.  There is no evidence of cardiac etiology.  The patient's vital signs are normal.  Lungs are clear to auscultation.  We will obtain a chest x-ray and reassess.  Patient's presentation is most consistent with acute complicated illness / injury requiring diagnostic workup.  ----------------------------------------- 5:44 AM on 06/21/2023 -----------------------------------------  Chest x-ray is clear.  The patient is stable for discharge home.  I counseled him on the results of the workup.  He gave strict return precautions, and he expressed understanding.   FINAL CLINICAL IMPRESSION(S) / ED DIAGNOSES   Final diagnoses:  Viral syndrome     Rx / DC Orders   ED Discharge Orders     None        Note:  This document was prepared using Dragon voice recognition  software and may include unintentional dictation errors.    Lind Repine, MD 06/21/23 [phone]

## 2024-01-25 ENCOUNTER — Ambulatory Visit: Payer: Self-pay

## 2024-01-26 ENCOUNTER — Ambulatory Visit: Payer: Self-pay
